# Patient Record
Sex: Male | Born: 1970 | Race: White | Hispanic: No | Smoking: Current every day smoker
Health system: Southern US, Community
[De-identification: ages and names within clinical notes are randomized; demographics above are authoritative.]

## PROBLEM LIST (undated history)

## (undated) DIAGNOSIS — I10 Essential (primary) hypertension: Secondary | ICD-10-CM

## (undated) DIAGNOSIS — Z789 Other specified health status: Secondary | ICD-10-CM

## (undated) HISTORY — PX: APPENDECTOMY: SHX54

## (undated) HISTORY — PX: HERNIA REPAIR: SHX51

---

## 1998-01-19 ENCOUNTER — Emergency Department (HOSPITAL_COMMUNITY): Admission: EM | Admit: 1998-01-19 | Discharge: 1998-01-19 | Payer: Self-pay | Admitting: Emergency Medicine

## 1998-11-12 ENCOUNTER — Emergency Department (HOSPITAL_COMMUNITY): Admission: EM | Admit: 1998-11-12 | Discharge: 1998-11-12 | Payer: Self-pay | Admitting: Emergency Medicine

## 1998-11-12 ENCOUNTER — Encounter: Payer: Self-pay | Admitting: Emergency Medicine

## 2012-01-29 ENCOUNTER — Encounter (HOSPITAL_COMMUNITY): Payer: Self-pay | Admitting: Emergency Medicine

## 2012-01-29 ENCOUNTER — Inpatient Hospital Stay (HOSPITAL_COMMUNITY)
Admission: EM | Admit: 2012-01-29 | Discharge: 2012-02-02 | DRG: 641 | Disposition: A | Discharge status: Home / Self Care | Payer: 59 | Attending: Internal Medicine | Admitting: Internal Medicine

## 2012-01-29 DIAGNOSIS — Z8249 Family history of ischemic heart disease and other diseases of the circulatory system: Secondary | ICD-10-CM

## 2012-01-29 DIAGNOSIS — G723 Periodic paralysis: Secondary | ICD-10-CM | POA: Diagnosis present

## 2012-01-29 DIAGNOSIS — E876 Hypokalemia: Principal | ICD-10-CM

## 2012-01-29 DIAGNOSIS — Z9089 Acquired absence of other organs: Secondary | ICD-10-CM

## 2012-01-29 DIAGNOSIS — Z87891 Personal history of nicotine dependence: Secondary | ICD-10-CM

## 2012-01-29 DIAGNOSIS — M6282 Rhabdomyolysis: Secondary | ICD-10-CM

## 2012-01-29 DIAGNOSIS — E860 Dehydration: Secondary | ICD-10-CM

## 2012-01-29 DIAGNOSIS — R030 Elevated blood-pressure reading, without diagnosis of hypertension: Secondary | ICD-10-CM | POA: Diagnosis present

## 2012-01-29 DIAGNOSIS — IMO0001 Reserved for inherently not codable concepts without codable children: Secondary | ICD-10-CM | POA: Diagnosis present

## 2012-01-29 HISTORY — DX: Other specified health status: Z78.9

## 2012-01-29 LAB — BASIC METABOLIC PANEL
BUN: 5 mg/dL — ABNORMAL LOW (ref 6–23)
CO2: 31 mEq/L (ref 19–32)
Calcium: 9.2 mg/dL (ref 8.4–10.5)
Chloride: 98 mEq/L (ref 96–112)
Creatinine, Ser: 0.73 mg/dL (ref 0.50–1.35)
GFR calc Af Amer: >90 mL/min (ref >90)
GFR calc non Af Amer: >90 mL/min (ref >90)
Glucose, Bld: 104 mg/dL — ABNORMAL HIGH (ref 70–99)
Potassium: <2 mEq/L — CL (ref 3.5–5.1)
Sodium: 139 mEq/L (ref 135–145)

## 2012-01-29 LAB — CK TOTAL AND CKMB (NOT AT ARMC)
CK, MB: 3.4 ng/mL (ref 0.3–4.0)
Relative Index: 0.6 (ref 0.0–2.5)
Total CK: 574 U/L — ABNORMAL HIGH (ref 7–232)

## 2012-01-29 MED ORDER — POTASSIUM CHLORIDE CRYS ER 20 MEQ PO TBCR
40.0000 meq | EXTENDED_RELEASE_TABLET | Freq: Once | ORAL | Status: AC
  Administered 2012-01-29: 40 meq via ORAL
  Filled 2012-01-29: qty 2

## 2012-01-29 MED ORDER — SODIUM CHLORIDE 0.9 % IV BOLUS (SEPSIS)
1000.0000 mL | Freq: Once | INTRAVENOUS | Status: AC
  Administered 2012-01-29: 1000 mL via INTRAVENOUS

## 2012-01-29 MED ORDER — POTASSIUM CHLORIDE 10 MEQ/100ML IV SOLN
10.0000 meq | Freq: Once | INTRAVENOUS | Status: AC
  Administered 2012-01-29: 10 meq via INTRAVENOUS
  Filled 2012-01-29: qty 100

## 2012-01-29 MED ORDER — KETOROLAC TROMETHAMINE 30 MG/ML IJ SOLN
30.0000 mg | Freq: Once | INTRAMUSCULAR | Status: AC
  Administered 2012-01-29: 30 mg via INTRAVENOUS
  Filled 2012-01-29: qty 1

## 2012-01-29 MED ORDER — POTASSIUM CHLORIDE CRYS ER 20 MEQ PO TBCR: 40.0000 meq | EXTENDED_RELEASE_TABLET | Freq: Once | ORAL | Status: DC

## 2012-01-29 MED ORDER — POTASSIUM CHLORIDE CRYS ER 20 MEQ PO TBCR
60.0000 meq | EXTENDED_RELEASE_TABLET | Freq: Two times a day (BID) | ORAL | Status: DC
  Administered 2012-01-30 – 2012-01-31 (×4): 60 meq via ORAL
  Filled 2012-01-29 (×5): qty 3

## 2012-01-29 MED ORDER — ONDANSETRON HCL 4 MG PO TABS: 4.0000 mg | ORAL_TABLET | Freq: Four times a day (QID) | ORAL | Status: DC | PRN

## 2012-01-29 MED ORDER — KETOROLAC TROMETHAMINE 30 MG/ML IJ SOLN
30.0000 mg | Freq: Once | INTRAMUSCULAR | Status: AC
  Administered 2012-01-29: 30 mg via INTRAVENOUS
  Filled 2012-01-29 (×2): qty 1

## 2012-01-29 MED ORDER — ONDANSETRON HCL 4 MG/2ML IJ SOLN
4.0000 mg | Freq: Four times a day (QID) | INTRAMUSCULAR | Status: DC | PRN
  Administered 2012-01-29: 4 mg via INTRAVENOUS
  Filled 2012-01-29: qty 2

## 2012-01-29 MED ORDER — SODIUM CHLORIDE 0.9 % IV SOLN
INTRAVENOUS | Status: DC
  Administered 2012-01-29: 1000 mL via INTRAVENOUS

## 2012-01-29 NOTE — ED Notes (Signed)
Reports all muscles are hurting--started yesterday-- went to park on Saturday and rock climbed; no fevers/chills; has not noticed any ticks or bug bites

## 2012-01-29 NOTE — ED Provider Notes (Addendum)
History     CSN: [id number]  Arrival date & time 01/29/12  1914   First MD Initiated Contact with Patient 01/29/12 2032      Chief Complaint  Patient presents with  . Muscle Pain    (Consider location/radiation/quality/duration/timing/severity/associated sxs/prior treatment) Patient is a 41 y.o. male presenting with musculoskeletal pain. The history is provided by the patient.  Muscle Pain Pertinent negatives include no chest pain, no abdominal pain, no headaches and no shortness of breath.   a 41 year old, male, with no significant past medical history presents emergency department complaining of eye oranges, in his arms and legs.  He states that he was in a mountain climbing rocks with his children and he was lifting them up and down over, and over for a few hours.  He denies nausea, vomiting, fevers, chills, or rash.  History reviewed. No pertinent past medical history.  Past Surgical History  Procedure Date  . Appendectomy   . Hernia repair     No family history on file.  History  Substance Use Topics  . Smoking status: Former Smoker    Types: Cigarettes    Quit date: 09/25/2011  . Smokeless tobacco: Not on file   Comment: using electronical cigs now  . Alcohol Use: No      Review of Systems  Constitutional: Negative for fever and chills.  HENT: Negative for neck pain and neck stiffness.   Eyes: Negative for redness.  Respiratory: Negative for shortness of breath.   Cardiovascular: Negative for chest pain.  Gastrointestinal: Negative for nausea, vomiting and abdominal pain.  Genitourinary:       No change in urine color  Musculoskeletal: Positive for myalgias.  Skin: Negative for rash.  Neurological: Negative for weakness and headaches.  Hematological: Does not bruise/bleed easily.  Psychiatric/Behavioral: Negative for confusion.  All other systems reviewed and are negative.    Allergies  Review of patient's allergies indicates no known  allergies.  Home Medications   Current Outpatient Rx  Name Route Sig Dispense Refill  . CETIRIZINE-PSEUDOEPHEDRINE ER 5-120 MG PO TB12 Oral Take 1 tablet by mouth daily.      BP 140/101  Pulse 92  Temp 98.6 F (37 C) (Oral)  Resp 18  Ht 5\' 7"  (1.702 m)  Wt 170 lb (77.111 kg)  BMI 26.63 kg/m2  SpO2 98%  Physical Exam  Nursing note and vitals reviewed. Constitutional: He is oriented to person, place, and time. He appears well-developed and well-nourished.  HENT:  Head: Normocephalic and atraumatic.  Eyes: Conjunctivae are normal.  Neck: Normal range of motion. Neck supple.  Cardiovascular: Normal rate.   No murmur heard. Pulmonary/Chest: Effort normal and breath sounds normal. No respiratory distress.  Abdominal: Soft. He exhibits no distension. There is no tenderness.  Musculoskeletal: Normal range of motion. He exhibits no edema.       Thigh and forearm tenderness, with no swelling  Lymphadenopathy:    He has no cervical adenopathy.  Neurological: He is alert and oriented to person, place, and time.  Skin: Skin is warm and dry.  Psychiatric: He has a normal mood and affect. Thought content normal.    ED Course  Procedures (including critical care time) 41 year old, male, with total body myalgias, after strenuous exercise, lifting.  His children for several hours.  We will establish an IV and give analgesics.  I will check him for rhabdomyolysis   Labs Reviewed  BASIC METABOLIC PANEL  CK TOTAL AND CKMB   No results found.  No diagnosis found.  ECG Sinus rhythm at 76 beats per minute. Right axis deviation. Normal intervals. Nonspecific T-wave changes  Spoke with the [facility] hospitalist.  He will admit to the hospital for potassium replacement, and treatment of rhabdomyolysis.  CRITICAL CARE Performed by: Nicholes Stairs   Total critical care time: 30 min  Critical care time was exclusive of separately billable procedures and treating other  patients.  Critical care was necessary to treat or prevent imminent or life-threatening deterioration.  Critical care was time spent personally by me on the following activities: development of treatment plan with patient and/or surrogate as well as nursing, discussions with consultants, evaluation of patient's response to treatment, examination of patient, obtaining history from patient or surrogate, ordering and performing treatments and interventions, ordering and review of laboratory studies, ordering and review of radiographic studies, pulse oximetry and re-evaluation of patient's condition.    Hypokalemia  With nonspecific tw changes on ecg Rhabdomyolysis         Cheri Guppy, MD 01/29/12 5621  Cheri Guppy, MD 01/29/12 2304

## 2012-01-29 NOTE — H&P (Addendum)
Dustin Kirby is an 41 y.o. male.   Patient was seen and examined on January 29, 2012 at 11:50 PM. PCP - none. Chief Complaint: Muscle cramps. HPI: 41 year-old male with no significant past history who has recently quit smoking 4 months ago had gone for hiking with his family last Saturday 3 days ago. Since then he has developed slowly generalized body aches and cramps which has been worsening to the point that he was not able to walk today and was brought to the ER. In the ER he is found to have severe hypokalemia with mildly elevated CK levels. Patient has been admitted for further management. His EKG shows U waves. Patient has difficulty moving both lower extremities. This happened since today morning. Denies any incontinence of urine or bowel. Denies chest pain shortness of breath nausea vomiting or diarrhea or abdominal pain. Patient states about 15 years ago he had an episode of shortness of breath and at that time he had muscle cramps and was told he had hypokalemia.  Past Medical History  Diagnosis Date  . No pertinent past medical history     Past Surgical History  Procedure Date  . Appendectomy   . Hernia repair     Family History  Problem Relation Age of Onset  . Hypertension Mother    Social History:  reports that he quit smoking about 4 months ago. His smoking use included Cigarettes. He does not have any smokeless tobacco history on file. He reports that he does not drink alcohol or use illicit drugs.  Allergies: No Known Allergies   (Not in a hospital admission)  Results for orders placed during the hospital encounter of 01/29/12 (from the past 48 hour(s))  BASIC METABOLIC PANEL     Status: Abnormal   Collection Time   01/29/12  9:28 PM      Component Value Range Comment   Sodium 139  135 - 145 mEq/L    Potassium <2.0 (*) 3.5 - 5.1 mEq/L    Chloride 98  96 - 112 mEq/L    CO2 31  19 - 32 mEq/L    Glucose, Bld 104 (*) 70 - 99 mg/dL    BUN 5 (*) 6 - 23 mg/dL    Creatinine,  Ser 0.98  0.50 - 1.35 mg/dL    Calcium 9.2  8.4 - 11.9 mg/dL    GFR calc non Af Amer >90  >90 mL/min    GFR calc Af Amer >90  >90 mL/min   CK TOTAL AND CKMB     Status: Abnormal   Collection Time   01/29/12  9:28 PM      Component Value Range Comment   Total CK 574 (*) 7 - 232 U/L    CK, MB 3.4  0.3 - 4.0 ng/mL    Relative Index 0.6  0.0 - 2.5   MAGNESIUM     Status: Normal   Collection Time   01/29/12  9:28 PM      Component Value Range Comment   Magnesium 2.0  1.5 - 2.5 mg/dL    No results found.  Review of Systems  HENT: Negative.   Eyes: Negative.   Respiratory: Negative.   Cardiovascular: Negative.   Gastrointestinal: Negative.   Genitourinary: Negative.   Musculoskeletal:       Muscle cramps.  Skin: Negative.   Neurological: Positive for weakness.       Lower extremity weakness.  Psychiatric/Behavioral: Negative.     Blood pressure 140/101,  pulse 92, temperature 98.6 F (37 C), temperature source Oral, resp. rate 18, height 5\' 7"  (1.702 m), weight 77.111 kg (170 lb), SpO2 98.00%. Physical Exam  Constitutional: He is oriented to person, place, and time. He appears well-developed and well-nourished. No distress.  HENT:  Head: Normocephalic and atraumatic.  Right Ear: External ear normal.  Left Ear: External ear normal.  Nose: Nose normal.  Mouth/Throat: Oropharynx is clear and moist. No oropharyngeal exudate.  Eyes: Conjunctivae are normal. Pupils are equal, round, and reactive to light. Right eye exhibits no discharge. Left eye exhibits no discharge. No scleral icterus.  Neck: Normal range of motion. Neck supple.  Cardiovascular: Normal rate and regular rhythm.   Respiratory: Effort normal and breath sounds normal. No respiratory distress. He has no wheezes. He has no rales.  GI: Soft. Bowel sounds are normal.  Musculoskeletal: He exhibits no edema and no tenderness.  Neurological: He is alert and oriented to person, place, and time.       Has poor strengths in  both lower extremities with intact deep tendon reflexes.  Skin: Skin is warm and dry. He is not diaphoretic.  Psychiatric: His behavior is normal.     Assessment/Plan #1. Severe hypokalemia - patient's potassium is less than 2 with EKG changes. At this time patient is getting potassium replacement through both IV and by mouth. Given his severe hypokalemia with metabolic alkalosis we should be concerned about other causes including Barter syndrome and Gitelman syndrome. 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 But also could be from dehydration. Check urine sodium and potassium and creatinine. I have also ordered urine chloride levels and urinary calcium levels specifically to look for Barter and Gitelman syndrome. Check renin aldosterone ratio.Check magnesium level. May consult nephrology in a.m.  #2. Mild rhabdomyolysis - aggressively hydrate with fluids. Follow CK levels and metabolic panel magnesium levels and phosphate levels. #3. Weakness of both lower extremity with muscle cramps - is most likely is from severe hypokalemia which I think will get better with replacement of his potassium and fluids. If it does not get better with replacement of potassium then may consult neurology.  A CBC and chest x-ray has been ordered and has to be followed.  CODE STATUS - full code.  Laasia Arcos N. 01/29/2012, 11:50 PM

## 2012-01-29 NOTE — ED Notes (Signed)
Patient feeling nauseated at this time.  Patient to be medicated per admission orders.

## 2012-01-30 ENCOUNTER — Encounter (HOSPITAL_COMMUNITY): Payer: Self-pay | Admitting: *Deleted

## 2012-01-30 ENCOUNTER — Inpatient Hospital Stay (HOSPITAL_COMMUNITY): Payer: 59

## 2012-01-30 LAB — COMPREHENSIVE METABOLIC PANEL
ALT: 17 U/L (ref 0–53)
Alkaline Phosphatase: 67 U/L (ref 39–117)
BUN: 5 mg/dL — ABNORMAL LOW (ref 6–23)
CO2: 31 mEq/L (ref 19–32)
Chloride: 105 mEq/L (ref 96–112)
GFR calc Af Amer: >90 mL/min (ref >90)
GFR calc non Af Amer: >90 mL/min (ref >90)
Glucose, Bld: 118 mg/dL — ABNORMAL HIGH (ref 70–99)
Potassium: <2 mEq/L — CL (ref 3.5–5.1)
Sodium: 143 mEq/L (ref 135–145)
Total Bilirubin: 0.5 mg/dL (ref 0.3–1.2)
Total Protein: 6.3 g/dL (ref 6.0–8.3)

## 2012-01-30 LAB — BASIC METABOLIC PANEL
Chloride: 106 mEq/L (ref 96–112)
GFR calc non Af Amer: >90 mL/min (ref >90)
Glucose, Bld: 112 mg/dL — ABNORMAL HIGH (ref 70–99)
Potassium: 3.5 mEq/L (ref 3.5–5.1)
Sodium: 143 mEq/L (ref 135–145)

## 2012-01-30 LAB — URINALYSIS, ROUTINE W REFLEX MICROSCOPIC
Bilirubin Urine: NEGATIVE
Glucose, UA: NEGATIVE mg/dL
Hgb urine dipstick: NEGATIVE
Nitrite: NEGATIVE
Specific Gravity, Urine: 1.006 (ref 1.005–1.030)
pH: 7 (ref 5.0–8.0)

## 2012-01-30 LAB — CHLORIDE, URINE, RANDOM: Chloride Urine: 84 mEq/L

## 2012-01-30 LAB — NA AND K (SODIUM & POTASSIUM), RAND UR: Potassium Urine: 6 mEq/L

## 2012-01-30 LAB — MAGNESIUM: Magnesium: 1.8 mg/dL (ref 1.5–2.5)

## 2012-01-30 MED ORDER — POTASSIUM CHLORIDE CRYS ER 20 MEQ PO TBCR
60.0000 meq | EXTENDED_RELEASE_TABLET | ORAL | Status: AC
  Administered 2012-01-30: 60 meq via ORAL
  Filled 2012-01-30: qty 3

## 2012-01-30 MED ORDER — POTASSIUM CHLORIDE 10 MEQ/100ML IV SOLN
10.0000 meq | INTRAVENOUS | Status: AC
  Administered 2012-01-30 (×6): 10 meq via INTRAVENOUS
  Filled 2012-01-30 (×6): qty 100

## 2012-01-30 MED ORDER — POTASSIUM CHLORIDE 10 MEQ/100ML IV SOLN
10.0000 meq | INTRAVENOUS | Status: AC
  Administered 2012-01-30 (×6): 10 meq via INTRAVENOUS
  Filled 2012-01-30: qty 100
  Filled 2012-01-30: qty 300
  Filled 2012-01-30: qty 200
  Filled 2012-01-30: qty 100

## 2012-01-30 MED ORDER — ACETAMINOPHEN 650 MG RE SUPP: 650.0000 mg | Freq: Four times a day (QID) | RECTAL | Status: DC | PRN

## 2012-01-30 MED ORDER — SODIUM CHLORIDE 0.9 % IJ SOLN
3.0000 mL | Freq: Two times a day (BID) | INTRAMUSCULAR | Status: DC
  Administered 2012-01-31: 3 mL via INTRAVENOUS

## 2012-01-30 MED ORDER — ACETAMINOPHEN 325 MG PO TABS
650.0000 mg | ORAL_TABLET | Freq: Four times a day (QID) | ORAL | Status: DC | PRN
  Administered 2012-02-01: 650 mg via ORAL
  Filled 2012-01-30: qty 2

## 2012-01-30 MED ORDER — POTASSIUM CHLORIDE IN NACL 40-0.9 MEQ/L-% IV SOLN
INTRAVENOUS | Status: DC
  Administered 2012-01-30: 125 mL/h via INTRAVENOUS
  Administered 2012-01-30: 01:00:00 via INTRAVENOUS
  Administered 2012-01-31: 75 mL/h via INTRAVENOUS
  Filled 2012-01-30 (×8): qty 1000

## 2012-01-30 NOTE — Progress Notes (Signed)
CRITICAL VALUE ALERT  Critical value received: potassium level <2  Date of notification: 01/30/2012  Time of notification: 0530  Critical value read back:yes  Nurse who received alert: Antanasia Kaczynski rn  MD notified (1st page):harduck NP  Time of first page: 0535  MD notified (2nd page):  Time of second page:  Responding MD:L.Harduck NP    Time MD responded:  [phone]

## 2012-01-30 NOTE — Progress Notes (Signed)
[facility] PROGRESS NOTE  Dustin Kirby:[id number] DOB: 1971-06-11 DOA: 01/29/2012 PCP: [name], MD  Assessment/Plan: Principal Problem:  *Hypokalemia Evaluating for Bartters and Gittlemans syndrome. 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 Episode occurred after exertion this time and possibly also 15  Yrs ago as well. Will repeat K+ level in AM. F/u on 24 hr urinary calcium levels.   Active Problems:  Rhabdomyolysis Mild - levels slightly increased- likely related to hypokalemia and cramping   Dehydration Improved. Can decrease IVF.    Code Status: full  Disposition Plan: follow in SDU due to unpredictable K+ levels.    Brief narrative: 41 year-old male with no significant past history who has recently quit smoking 4 months ago had gone for hiking with his family last Saturday 3 days ago. Since then he has developed slowly generalized body aches and cramps which has been worsening to the point that he was not able to walk today and was brought to the ER. In the ER he is found to have severe hypokalemia with mildly elevated CK levels. Patient has been admitted for further management. His EKG shows U waves. Patient has difficulty moving both lower extremities. This happened since today morning. Denies any incontinence of urine or bowel. Denies chest pain shortness of breath nausea vomiting or diarrhea or abdominal pain. Patient states about 15 years ago he had an episode of shortness of breath and at that time he had muscle cramps and was told he had hypokalemia.   Consultants:  Will speak with nephrology to assist in f/u of lab work as patient does not have PCP.   HPI/Subjective: He is able to move his legs today and has ambulated to the bathroom. He is having some soreness in left groin which was present prior to hiking as well.   Objective: Filed Vitals:   01/30/12 0600 01/30/12 0755 01/30/12 1253 01/30/12 1555  BP: 112/68 118/65 145/72 141/77  Pulse: 61 88  82 94  Temp:  97.9 F (36.6 C) 98.3 F (36.8 C) 98.5 F (36.9 C)  TempSrc:  Oral Oral Oral  Resp: 24 21 29 18   Height:      Weight:      SpO2: 98% 100% 100% 99%    Intake/Output Summary (Last 24 hours) at 01/30/12 1710 Last data filed at 01/30/12 1254  Gross per 24 hour  Intake   3585 ml  Output   2150 ml  Net   1435 ml    Exam:   General:  No acute distress  Cardiovascular: RRR, No murmurs  Respiratory: CTA b/l  Abdomen: soft, NT, ND, BS+  Ext: no c/c/e  Neuro: 5/5 strength in all 4 extremities, Pt has pain in b/l hips when asked to keep legs elevated against pressure.   Data Reviewed: Basic Metabolic Panel:  Lab 01/30/12 9562 01/30/12 0415 01/29/12 2128  NA 143 143 139  K 3.5 <2.0* <2.0*  CL 106 105 98  CO2 29 31 31   GLUCOSE 112* 118* 104*  BUN 5* 5* 5*  CREATININE 0.68 0.79 0.73  CALCIUM 8.3* 8.0* 9.2  MG -- 1.8 2.0  PHOS -- 1.1* --   Liver Function Tests:  Lab 01/30/12 0415  AST 28  ALT 17  ALKPHOS 67  BILITOT 0.5  PROT 6.3  ALBUMIN 3.4*   No results found for this basename: LIPASE:5,AMYLASE:5 in the last 168 hours No results found for this basename: AMMONIA:5 in the last 168 hours CBC: No results found for this basename: WBC:5,NEUTROABS:5,HGB:5,HCT:5,MCV:5,PLT:5  in the last 168 hours Cardiac Enzymes:  Lab 01/30/12 0415 01/29/12 2128  CKTOTAL 614* 574*  CKMB 3.9 3.4  CKMBINDEX -- --  TROPONINI <0.30 --   BNP (last 3 results) No results found for this basename: PROBNP:3 in the last 8760 hours CBG: No results found for this basename: GLUCAP:5 in the last 168 hours  Recent Results (from the past 240 hour(s))  MRSA PCR SCREENING     Status: Normal   Collection Time   01/30/12 12:31 AM      Component Value Range Status Comment   MRSA by PCR NEGATIVE  NEGATIVE Final      Studies: Dg Chest Port 1 View  01/30/2012  *RADIOLOGY REPORT*  Clinical Data: Shortness of breath.  PORTABLE CHEST - 1 VIEW  Comparison: None.  Findings: Heart and  mediastinal contours are within normal limits. No focal opacities or effusions.  No acute bony abnormality.  IMPRESSION: No active cardiopulmonary disease.  Original Report Authenticated By: Cyndie Chime, M.D.    Scheduled Meds:   . ketorolac  30 mg Intravenous Once  . ketorolac  30 mg Intravenous Once  . potassium chloride  10 mEq Intravenous Once  . potassium chloride  10 mEq Intravenous Q1 Hr x 6  . potassium chloride  10 mEq Intravenous Q1 Hr x 6  . potassium chloride  40 mEq Oral Once  . potassium chloride  40 mEq Oral Once  . potassium chloride  60 mEq Oral BID  . potassium chloride  60 mEq Oral NOW  . sodium chloride  1,000 mL Intravenous Once  . sodium chloride  3 mL Intravenous Q12H  . DISCONTD: sodium chloride   Intravenous STAT   Continuous Infusions:   . 0.9 % NaCl with KCl 40 mEq / L 125 mL/hr (01/30/12 1648)    ________________________________________________________________________  Time spent: 25 min    [facility]  [facility] Pager [phone] If 8PM-8AM, please contact night-coverage at [email or web address], password [facility] 01/30/2012, 5:10 PM  LOS: 1 day

## 2012-01-31 DIAGNOSIS — G723 Periodic paralysis: Secondary | ICD-10-CM | POA: Diagnosis present

## 2012-01-31 LAB — BASIC METABOLIC PANEL
BUN: 3 mg/dL — ABNORMAL LOW (ref 6–23)
Chloride: 101 mEq/L (ref 96–112)
GFR calc non Af Amer: >90 mL/min (ref >90)
Glucose, Bld: 83 mg/dL (ref 70–99)
Potassium: 4 mEq/L (ref 3.5–5.1)

## 2012-01-31 MED ORDER — POTASSIUM CHLORIDE CRYS ER 20 MEQ PO TBCR
40.0000 meq | EXTENDED_RELEASE_TABLET | Freq: Two times a day (BID) | ORAL | Status: DC
  Administered 2012-01-31 – 2012-02-02 (×4): 40 meq via ORAL
  Filled 2012-01-31 (×5): qty 2

## 2012-01-31 NOTE — Progress Notes (Signed)
[facility] Progress Note [place] TEAM 1 - Stepdown/ICU TEAM   NAOD SWEETLAND AVW:[id number] DOB: 1971-03-08 DOA: 01/29/2012 PCP: [name], MD  Brief narrative: 41 year-old male with no significant past history who quit smoking 4 months ago had gone for a hike. Since then he has developed slowly generalized body aches and cramps which have been worsening to the point that he was not able to walk today and was brought to the ER. In the ER he was found to have severe hypokalemia with mildly elevated CK levels. Patient has been admitted for further management. His EKG shows U waves. Patient has difficulty moving both lower extremities. Denies any incontinence of urine or bowel. Denies chest pain shortness of breath nausea vomiting or diarrhea or abdominal pain. Patient states about 15 years ago he had an episode of shortness of breath and at that time he had muscle cramps and was told he had hypokalemia.  Assessment/Plan:  Hypokalemia Evaluating for Bartters and Gittlemans syndromes vs/ hypokalemia periodic paralysis - Gittlemans syndrome is far more common however magnesium has proven to be normal - there is no evidence of metabolic alkalosis - potassium appears to be stabilizing but we will need to continue to follow it to assure that it does not rapidly dropping - patient is now freely ambulating  Myositis / very mild Rhabdomyolysis CK elevation is actually quite mild but is indeed climbing - we will need to follow it to assure that it normalizes   Severe dehydration This has been corrected with volume resuscitation  Code Status: Full Disposition Plan: Transfer to telemetry bed  Consultants: None  Procedures: None  Antibiotics: None  DVT prophylaxis: SCDs  HPI/Subjective: The patient is doing quite well today.  He reports no further cramps or muscle weakness.  He is able to an event about the room without difficulty.  He is tolerating a regular  diet.   Objective: Blood pressure 135/74, pulse 77, temperature 98.2 F (36.8 C), temperature source Oral, resp. rate 28, height 5\' 7"  (1.702 m), weight 77 kg (169 lb 12.1 oz), SpO2 99.00%.  Intake/Output Summary (Last 24 hours) at 01/31/12 1254 Last data filed at 01/31/12 1100  Gross per 24 hour  Intake   4540 ml  Output   5753 ml  Net  -1213 ml     Exam: General: No acute respiratory distress Lungs: Clear to auscultation bilaterally without wheezes or crackles Cardiovascular: Regular rate and rhythm without murmur gallop or rub  Abdomen: Nontender, nondistended, soft, bowel sounds positive, no rebound, no ascites, no appreciable mass Extremities: No significant cyanosis, clubbing, or edema bilateral lower extremities  Data Reviewed: Basic Metabolic Panel:  Lab 01/31/12 8295 01/30/12 2133 01/30/12 1422 01/30/12 0415 01/29/12 2128  NA -- -- 143 143 139  K 3.9 3.4* 3.5 <2.0* <2.0*  CL -- -- 106 105 98  CO2 -- -- 29 31 31   GLUCOSE -- -- 112* 118* 104*  BUN -- -- 5* 5* 5*  CREATININE -- -- 0.68 0.79 0.73  CALCIUM -- -- 8.3* 8.0* 9.2  MG -- -- -- 1.8 2.0  PHOS -- -- -- 1.1* --   Liver Function Tests:  Lab 01/30/12 0415  AST 28  ALT 17  ALKPHOS 67  BILITOT 0.5  PROT 6.3  ALBUMIN 3.4*   Cardiac Enzymes:  Lab 01/30/12 0415 01/29/12 2128  CKTOTAL 614* 574*  CKMB 3.9 3.4  CKMBINDEX -- --  TROPONINI <0.30 --   Recent Results (from the past 240 hour(s))  MRSA PCR SCREENING     Status: Normal   Collection Time   01/30/12 12:31 AM      Component Value Range Status Comment   MRSA by PCR NEGATIVE  NEGATIVE Final     Studies:  Recent x-ray studies have been reviewed in detail by the Attending Physician  Scheduled Meds:  Reviewed in detail by the Attending Physician   Lonia Blood, MD [facility] Office  [phone] Pager [phone]  On-Call/Text Page:      [email or web address]      password [email or web address]  If 7PM-7AM, please contact  night-coverage [email or web address] Password [email or web address] 01/31/2012, 12:54 PM   LOS: 2 days

## 2012-02-01 DIAGNOSIS — G723 Periodic paralysis: Secondary | ICD-10-CM

## 2012-02-01 LAB — BASIC METABOLIC PANEL
BUN: 5 mg/dL — ABNORMAL LOW (ref 6–23)
BUN: 6 mg/dL (ref 6–23)
Calcium: 9.4 mg/dL (ref 8.4–10.5)
Calcium: 9.8 mg/dL (ref 8.4–10.5)
Creatinine, Ser: 0.68 mg/dL (ref 0.50–1.35)
Creatinine, Ser: 0.74 mg/dL (ref 0.50–1.35)
GFR calc Af Amer: >90 mL/min (ref >90)
GFR calc Af Amer: >90 mL/min (ref >90)
GFR calc non Af Amer: >90 mL/min (ref >90)
GFR calc non Af Amer: >90 mL/min (ref >90)
Glucose, Bld: 99 mg/dL (ref 70–99)
Potassium: 4.5 mEq/L (ref 3.5–5.1)

## 2012-02-01 LAB — CALCIUM, URINE, 24 HOUR
Calcium, 24 hour urine: 80 mg/d — ABNORMAL LOW (ref 100–250)
Urine Total Volume-UCA24: 8000 mL

## 2012-02-01 LAB — TSH: TSH: 4.259 u[IU]/mL (ref 0.350–4.500)

## 2012-02-01 MED ORDER — MAGNESIUM SULFATE 50 % IJ SOLN
8.0000 g | Freq: Once | INTRAVENOUS | Status: AC
  Administered 2012-02-01: 8 g via INTRAVENOUS
  Filled 2012-02-01: qty 16

## 2012-02-01 MED ORDER — SODIUM CHLORIDE 0.9 % IV SOLN
INTRAVENOUS | Status: DC
  Administered 2012-02-01: 10:00:00 via INTRAVENOUS

## 2012-02-01 NOTE — Progress Notes (Signed)
[facility] Progress Note [place] TEAM 1 - Stepdown/ICU TEAM   QUAVION BOULE RUE:[id number] DOB: July 28, 1970 DOA: 01/29/2012 PCP: [name], MD  Brief narrative: 41 year-old male with no significant past history who quit smoking 4 months ago had gone for a hike. Since then he has developed slowly generalized body aches and cramps which have been worsening to the point that he was not able to walk today and was brought to the ER. In the ER he was found to have severe hypokalemia with mildly elevated CK levels. Patient has been admitted for further management. His EKG shows U waves. Patient has difficulty moving both lower extremities. Denies any incontinence of urine or bowel. Denies chest pain shortness of breath nausea vomiting or diarrhea or abdominal pain. Patient states about 15 years ago he had an episode of shortness of breath and at that time he had muscle cramps and was told he had hypokalemia.  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 Lower than average height. 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   Myositis / very mild Rhabdomyolysis -CK elevation is actually quite mild but is indeed climbing - we will need to follow it to assure that it normalizes. -check a TSH which can cause elevation in CK.  Severe dehydration This has been corrected with volume resuscitation  Code Status: Full Disposition Plan: Transfer to telemetry bed  Consultants: Renal  Procedures: None  Antibiotics: None  DVT prophylaxis: SCDs  HPI/Subjective: The patient is doing quite well today.  He reports no further cramps or muscle weakness.   Able to ambulate with out any difficulties.  Objective: Blood pressure 113/73,  pulse 73, temperature 98.2 F (36.8 C), temperature source Oral, resp. rate 20, height 5\' 7"  (1.702 m), weight 76.1 kg (167 lb 12.3 oz), SpO2 99.00%.  Intake/Output Summary (Last 24 hours) at 02/01/12 1331 Last data filed at 02/01/12 0900  Gross per 24 hour  Intake 1548.33 ml  Output   1375 ml  Net 173.33 ml     Exam: General: No acute respiratory distress Lungs: Clear to auscultation bilaterally without wheezes or crackles Cardiovascular: Regular rate and rhythm without murmur gallop or rub  Abdomen: Nontender, nondistended, soft, bowel sounds positive, no rebound, no ascites, no appreciable mass Extremities: No significant cyanosis, clubbing, or edema bilateral lower extremities  Data Reviewed: Basic Metabolic Panel:  Lab 02/01/12 1478 01/31/12 1735 01/31/12 0525 01/30/12 2133 01/30/12 1422 01/30/12 0415 01/29/12 2128  NA 137 137 -- -- 143 143 139  K 4.5 4.0 3.9 3.4* 3.5 -- --  CL 101 101 -- -- 106 105 98  CO2 28 28 -- -- 29 31 31   GLUCOSE 82 83 -- -- 112* 118* 104*  BUN 5* 3* -- -- 5* 5* 5*  CREATININE 0.68 0.65 -- -- 0.68 0.79 0.73  CALCIUM 9.4 9.4 -- -- 8.3* 8.0* 9.2  MG -- -- -- -- -- 1.8 2.0  PHOS 2.8 2.3 -- -- -- 1.1* --   Liver Function Tests:  Lab 01/30/12 0415  AST 28  ALT 17  ALKPHOS 67  BILITOT 0.5  PROT 6.3  ALBUMIN 3.4*   Cardiac Enzymes:  Lab 02/01/12 0520 01/30/12 0415 01/29/12 2128  CKTOTAL 1069* 614* 574*  CKMB -- 3.9 3.4  CKMBINDEX -- -- --  TROPONINI -- <0.30 --   Recent Results (from the past 240 hour(s))  MRSA PCR SCREENING     Status: Normal   Collection Time   01/30/12 12:31 AM      Component Value Range Status Comment   MRSA by PCR NEGATIVE  NEGATIVE Final     Studies:  Recent x-ray studies have been reviewed in detail by the Attending Physician  Scheduled Meds:  Reviewed in detail by the Attending Physician   Lonia Blood, MD [facility] Office  [phone] Pager [phone]  On-Call/Text Page:       [email or web address]      password [email or web address]  If 7PM-7AM, please contact night-coverage [email or web address] Password [email or web address] 02/01/2012, 1:31 PM   LOS: 3 days

## 2012-02-01 NOTE — Consult Note (Addendum)
Nephrology Service Initial Consult Note  Reason for Consult: Hypokalemia Referring Physician: Dr. David Kirby  HPI: Dustin Kirby is an 41 y.o. male, with no significant medical history, presenting with generalized "body aches" and hypokalemia.  The patient notes that on 8/3, he spent a lot of time outside in the woods with his daughters, and notes sweating a lot that day but not drinking many fluids.  That evening, and particularly the next day, he noted progressive body aches, most noticeable in his legs, but also in his arms and torso, making it difficult for him to stand or move due to pain.  He presented to [facility] on 8/5, and was noted to have significant hypokalemia with K <2.0 and U waves on EKG.  He was repleted with 550 meQ of potassium over the last few days.  Further questioning reveals that patient notes 1 day of diarrhea about 1 week prior to admission, a mild back injury 2 weeks prior to admission, and intermittent dysuria which has been present "all my life".  He notes no fevers, chills, cp, SOB, nausea/vomiting, abd pain.  The patient notes 1 prior episode of hypokalemia about 15 yrs ago, in which he notes a few days of "not eating well", and experiencing symptoms of hyperventilation, and similar difficulty moving due to muscle aches.  He was admitted to [facility], diagnosed with hypokalemia, repleted, but believes he did not go home on potassium supplementation.  He has only seen occasional Urgent Care physicians since that time.   PMH:   Past Medical History  Diagnosis Date  . No pertinent past medical history   See above--1 prior episode of low K and weakness 15 yr ago No past hx hypertension  PSH:   Past Surgical History  Procedure Date  . Appendectomy   . Hernia repair     Allergies: No Known Allergies  Medications:   Medications Prior to Admission  Medication Sig Dispense Refill  . cetirizine-pseudoephedrine (ZYRTEC-D) 5-120 MG per tablet Take 1 tablet by mouth daily.      No  diuretics, no laxatives, no NSAIDs Scheduled Meds:   . potassium chloride  40 mEq Oral BID   Continuous Infusions:   . sodium chloride 100 mL/hr at 02/01/12 1000  . DISCONTD: 0.9 % NaCl with KCl 40 mEq / L 50 mL/hr at 02/01/12 0616   PRN Meds:.acetaminophen, acetaminophen, ondansetron (ZOFRAN) IV, ondansetron  Discontinued Meds:   Medications Discontinued During This Encounter  Medication Reason  . 0.9 %  sodium chloride infusion   . sodium chloride 0.9 % injection 3 mL   . potassium chloride SA (K-DUR,KLOR-CON) CR tablet 60 mEq   . potassium chloride SA (K-DUR,KLOR-CON) CR tablet 40 mEq   . 0.9 % NaCl with KCl 40 mEq / L  infusion     Family History:   Family History  Problem Relation Age of Onset  . 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  ROS: General: no fevers, chills, changes in weight, changes in appetite Skin: no rash HEENT: no blurry vision, hearing changes, sore throat Pulm: no  dyspnea, coughing, wheezing CV: no chest pain, palpitations, shortness of breath Abd: no abdominal pain, nausea/vomiting, diarrhea/constipation GU: no hematuria, polyuria Ext: see HPI Neuro: see HPI  Blood pressure 134/84, pulse 85, temperature 98.7 F (37.1 C), temperature source Oral, resp. rate 20, height 5\' 7"  (1.702 m), weight 167 lb 12.3 oz (76.1 kg), SpO2 98.00%.  PEX General: alert, cooperative, and in no apparent distress HEENT: pupils equal round and reactive to light, vision grossly intact, oropharynx clear and non-erythematous, upper  dentures, poor oral hygiene lower teeth  Neck: supple, no lymphadenopathy, no JVD Lungs: clear to ascultation bilaterally, normal work of respiration, no wheezes, rales, ronchi Heart: regular rate and rhythm, no murmurs, gallops, or rubs Abdomen: soft, non-tender, non-distended, normal bowel sounds Msk: no arthritis, warmth, or erythema Extremities: no cyanosis, clubbing, or edema Neurologic: alert & oriented X3, cranial nerves II-XII intact, strength grossly intact, sensation intact to light touch, R handed  Creatinine, Ser  Date/Time Value Range Status  02/01/2012  5:20 AM 0.68  0.50 - 1.35 mg/dL Final  10/29/2128  8:65 PM 0.65  0.50 - 1.35 mg/dL Final  01/01/4695  2:95 PM 0.68  0.50 - 1.35 mg/dL Final  08/03/4130  4:40 AM 0.79  0.50 - 1.35 mg/dL Final  1/0/2725  3:66 PM 0.73  0.50 - 1.35 mg/dL Final    Results for orders placed during the hospital encounter of 01/29/12 (from the past 48 hour(s))  POTASSIUM     Status: Abnormal   Collection Time   01/30/12  9:33 PM      Component Value Range Comment   Potassium 3.4 (*) 3.5 - 5.1 mEq/L   POTASSIUM     Status: Normal   Collection Time   01/31/12  5:25 AM      Component Value Range Comment   Potassium 3.9  3.5 - 5.1 mEq/L   BASIC METABOLIC PANEL     Status: Abnormal   Collection Time   01/31/12  5:35 PM      Component Value Range Comment   Sodium 137  135 - 145 mEq/L    Potassium 4.0  3.5 - 5.1 mEq/L    Chloride 101  96 - 112 mEq/L    CO2 28  19 - 32 mEq/L    Glucose, Bld 83  70 - 99 mg/dL    BUN 3 (*) 6 - 23 mg/dL    Creatinine, Ser 4.40  0.50 - 1.35 mg/dL    Calcium 9.4  8.4 - 34.7 mg/dL    GFR calc non Af Amer >90  >90 mL/min    GFR calc Af Amer >90  >90 mL/min   PHOSPHORUS     Status: Normal   Collection Time   01/31/12  5:35 PM      Component Value Range Comment   Phosphorus 2.3  2.3 - 4.6 mg/dL   CK     Status: Abnormal   Collection Time   02/01/12  5:20 AM      Component Value Range Comment   Total CK 1069 (*) 7 - 232 U/L     BASIC METABOLIC PANEL     Status: Abnormal   Collection Time   02/01/12  5:20 AM      Component Value Range Comment   Sodium 137  135 - 145 mEq/L    Potassium 4.5  3.5 - 5.1 mEq/L    Chloride 101  96 - 112 mEq/L    CO2 28  19 - 32 mEq/L  Glucose, Bld 82  70 - 99 mg/dL    BUN 5 (*) 6 - 23 mg/dL    Creatinine, Ser 1.61  0.50 - 1.35 mg/dL    Calcium 9.4  8.4 - 09.6 mg/dL    GFR calc non Af Amer >90  >90 mL/min    GFR calc Af Amer >90  >90 mL/min   PHOSPHORUS     Status: Normal   Collection Time   02/01/12  5:20 AM      Component Value Range Comment   Phosphorus 2.8  2.3 - 4.6 mg/dL     No results found.   Assessment/Plan: The patient is a 41 yo man, no significant PMH, presenting with muscle weakness and pain, found to have hypokalemia of unknown etiology.  # Hypokalemia - The patient presents with severe hypokalemia <2, with a reported history of hypokalemia 15 years ago.  The etiology of this abnormality is unclear.  Previously proposed theories include hypokalemic periodic paralysis vs Bartter/Gitelman syndrome.  Now, potassium has normalized, but CK continues to slowly uptrend.  Urine potassium was appropriately low.  Urine chloride was not low.  Serum phosphorus was initially low.  TSH high-normal. -aldosterone/renin levels pending -check PTH (hypophosphatemia in bartter/gitelman can represent secondary hyperPTH) -monitor electrolytes daily -patient currently on K-dur 40 BID, with K = 4.5 today.  If K still normal tomorrow, d/c K-dur and observe  # Borderline Blood Pressure - no history of HTN (though limited interaction with healthcare system), with borderline elevated BP's during this admission. -continue to monitor -renin/aldosterone levels pending; may be helpful in choosing antihypertensive if needed  # Elevated CK - the patient presented with mildly elevated CK, which has continued to rise.  This may represent mild rhabdo vs ?autoimmune process (polymyositis).  Symptoms  appear to have significantly improved/resolved.  LFT's are not elevated -continue to monitor CK periodically -check ESR  Signed, Janalyn Harder 02/01/2012, 2:57 PM  I reviewed history with  Dustin Kirby.  We examined Dustin Kirby together.  He doesn't take laxatives or diuretics.  He has no hx of vomiting. Will send urine for thiazides and stool for phenolphthalein.   He's not hypertensive.  He is not acidotic.  Mg is not low.  K has been repleted with >500 mEq KCl since admission. TSH not high. Would continue po KCl He will get 72 mEq MgSO4 over next 12 hr. CK likely high due to low K and phosphorus.   If it looks like he has Bartter or Gitelman syndrome, I'd suggest he see Dustin Kirby [email or web address] who has a clinic (at [facility]) where she sees patients with these types of uncommon  tubular disorders.    Dustin Gravel, MD

## 2012-02-02 LAB — PHOSPHORUS: Phosphorus: 2.8 mg/dL (ref 2.3–4.6)

## 2012-02-02 LAB — CBC
Hemoglobin: 16.6 g/dL (ref 13.0–17.0)
MCH: 31.1 pg (ref 26.0–34.0)
MCV: 85.2 fL (ref 78.0–100.0)
RBC: 5.33 MIL/uL (ref 4.22–5.81)
WBC: 11.5 10*3/uL — ABNORMAL HIGH (ref 4.0–10.5)

## 2012-02-02 LAB — BASIC METABOLIC PANEL
BUN: 5 mg/dL — ABNORMAL LOW (ref 6–23)
Chloride: 100 mEq/L (ref 96–112)
GFR calc Af Amer: >90 mL/min (ref >90)
Glucose, Bld: 83 mg/dL (ref 70–99)
Potassium: 4.6 mEq/L (ref 3.5–5.1)
Sodium: 136 mEq/L (ref 135–145)

## 2012-02-02 LAB — MAGNESIUM: Magnesium: 3.9 mg/dL — ABNORMAL HIGH (ref 1.5–2.5)

## 2012-02-02 NOTE — Care Management Note (Signed)
    Page 1 of 1   02/02/2012     1:55:06 PM   CARE MANAGEMENT NOTE 02/02/2012  Patient:  Dustin Kirby, Dustin Kirby   Account Number:  [id number]  Date Initiated:  01/30/2012  Documentation initiated by:  Alvira Philips Assessment:   41 yr-old male adm with dx of hypokalemia; lives with family, independent PTA     Action/Plan:   Anticipated DC Date:  02/02/2012   Anticipated DC Plan:  HOME/SELF CARE      DC Planning Services  CM consult      Choice offered to / List presented to:             Status of service:  Completed, signed off Medicare Important Message given?   (If response is "NO", the following Medicare IM given date fields will be blank) Date Medicare IM given:   Date Additional Medicare IM given:    Discharge Disposition:  HOME/SELF CARE  Per UR Regulation:  Reviewed for med. necessity/level of care/duration of stay  If discussed at Long Length of Stay Meetings, dates discussed:    Comments:  Contact:  Anthonie Lotito, mother [phone]  02/02/12 13:53 Letha Cape RN, BSN [phone] patient lives at home with kids, pta independent, pateint has medication coverage and transportation.  Secretary made f/u apt for patient.  Patient for dc today.

## 2012-02-02 NOTE — Progress Notes (Signed)
[facility] Progress Note [place] TEAM 1 - Stepdown/ICU TEAM   Dustin Kirby LKG:[id number] DOB: May 27, 1971 DOA: 01/29/2012 PCP: [name], MD  Brief narrative: 41 year-old male with no significant past history who quit smoking 4 months ago had gone for a hike. Since then he has developed slowly generalized body aches and cramps which have been worsening to the point that he was not able to walk today and was brought to the ER. In the ER he was found to have severe hypokalemia with mildly elevated CK levels. Patient has been admitted for further management. His EKG shows U waves. Patient has difficulty moving both lower extremities. Denies any incontinence of urine or bowel. Denies chest pain shortness of breath nausea vomiting or diarrhea or abdominal pain. Patient states about 15 years ago he had an episode of shortness of breath and at that time he had muscle cramps and was told he had hypokalemia.  Assessment/Plan:  Hypokalemia -Appreciate renal assistance, will schedule appointment with Dr. Chinita Pester, hypokalemia continues to be stable. -Pending labs could probably be followed up as an outpatient. We'll discuss with renal.  Myositis / very mild Rhabdomyolysis -TSH within normal limits, CT improving today.  Severe dehydration This has been corrected with volume resuscitation  Code Status: Full Disposition Plan: Transfer to telemetry bed  Consultants: Renal  Procedures: None  Antibiotics: None  DVT prophylaxis: SCDs  HPI/Subjective: The patient is doing quite well today.  He reports no further cramps or muscle weakness.   Able to ambulate with out any difficulties.  Objective: Blood pressure 123/83, pulse 74, temperature 97.7 F (36.5 C), temperature source Oral, resp. rate 20, height 5\' 7"  (1.702 m), weight 76.1 kg (167 lb 12.3 oz), SpO2 100.00%.  Intake/Output Summary (Last 24 hours) at 02/02/12 0846 Last data filed at 02/02/12 0700  Gross per 24 hour    Intake 2560.83 ml  Output    600 ml  Net 1960.83 ml     Exam: General: No acute respiratory distress Lungs: Clear to auscultation bilaterally without wheezes or crackles Cardiovascular: Regular rate and rhythm without murmur gallop or rub  Abdomen: Nontender, nondistended, soft, bowel sounds positive, no rebound, no ascites, no appreciable mass Extremities: No significant cyanosis, clubbing, or edema bilateral lower extremities  Data Reviewed: Basic Metabolic Panel:  Lab 02/02/12 6644 02/01/12 1730 02/01/12 0520 01/31/12 1735 01/31/12 0525 01/30/12 1422 01/30/12 0415 01/29/12 2128  NA 136 137 137 137 -- 143 -- --  K 4.6 4.5 4.5 4.0 3.9 -- -- --  CL 100 98 101 101 -- 106 -- --  CO2 27 30 28 28  -- 29 -- --  GLUCOSE 83 99 82 83 -- 112* -- --  BUN 5* 6 5* 3* -- 5* -- --  CREATININE 0.68 0.74 0.68 0.65 -- 0.68 -- --  CALCIUM 9.1 9.8 9.4 9.4 -- 8.3* -- --  MG 3.9* -- -- -- -- -- 1.8 2.0  PHOS 2.8 -- 2.8 2.3 -- -- 1.1* --   Liver Function Tests:  Lab 01/30/12 0415  AST 28  ALT 17  ALKPHOS 67  BILITOT 0.5  PROT 6.3  ALBUMIN 3.4*   Cardiac Enzymes:  Lab 02/02/12 0625 02/01/12 0520 01/30/12 0415 01/29/12 2128  CKTOTAL 625* 1069* 614* 574*  CKMB -- -- 3.9 3.4  CKMBINDEX -- -- -- --  TROPONINI -- -- <0.30 --   Recent Results (from the past 240 hour(s))  MRSA PCR SCREENING     Status: Normal   Collection Time  01/30/12 12:31 AM      Component Value Range Status Comment   MRSA by PCR NEGATIVE  NEGATIVE Final     Studies:  Recent x-ray studies have been reviewed in detail by the Attending Physician  Scheduled Meds:  Reviewed in detail by the Attending Physician   Lonia Blood, MD [facility] Office  [phone] Pager [phone]  On-Call/Text Page:      [email or web address]      password [email or web address]  If 7PM-7AM, please contact night-coverage [email or web address] Password [email or web address] 02/02/2012, 8:46 AM   LOS: 4 days

## 2012-02-02 NOTE — Progress Notes (Signed)
Patient discharge teaching given, including activity, diet, follow-up appoints, and medications. Patient verbalized understanding of all discharge instructions. IV access was d/c'd. Vitals are stable. Skin is intact except as charted in most recent assessments. Pt to be escorted out by NT, to be driven home by family. 

## 2012-02-02 NOTE — Discharge Summary (Signed)
Physician Discharge Summary  Dustin Kirby UJW:[id number] DOB: 08-28-1970 DOA: 01/29/2012  PCP: [name], MD  Admit date: 01/29/2012 Discharge date: 02/02/2012  Discharge Diagnoses:  Principal Problem:  *Hypokalemia Active Problems:  Rhabdomyolysis  Dehydration  Hypokalemic periodic paralysis   Discharge Condition: Stable for discharge  Disposition:  Follow-up Information    Follow up with DEFAULT,PROVIDER, MD.   Contact information:   [address] [place] [address] [phone]       Follow up with [name], NT in 6 weeks. (sep 12.)    Contact information:   - [address] [place] [address]          Diet: Regular diet Wt Readings from Last 3 Encounters:  01/31/12 76.1 kg (167 lb 12.3 oz)    History of present illness:  41 year-old male with no significant past history who has recently quit smoking 4 months ago had gone for hiking with his family last Saturday 3 days ago. Since then he has developed slowly generalized body aches and cramps which has been worsening to the point that he was not able to walk today and was brought to the ER. In the ER he is found to have severe hypokalemia with mildly elevated CK levels. Patient has been admitted for further management. His EKG shows U waves. Patient has difficulty moving both lower extremities. This happened since today morning. Denies any incontinence of urine or bowel. Denies chest pain shortness of breath nausea vomiting or diarrhea or abdominal pain. Patient states about 15 years ago he had an episode of shortness of breath and at that time he had muscle cramps and was told he had hypokalemia.   Hospital Course:  Principal Problem: Hypokalemia/ Hypokalemic periodic paralysis: -The patient was admitted to the hospital was aggressively repleted, he is not acidotic, was not hypertensive, magnesium was not low. His potassium was repleted lower 500 mEq, also TSH was checked which was within normal  limits his potassium stabilized. 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 Patient just took them to followup at the [facility] nephrology clinic. And got him an appointment for 03/21/2012, as they see patient with these uncommon tubular disorder.  Rhabdomyolysis -This is probably secondary to severe hypokalemia, should you have any further aggressively this improved.  Dehydration -Does resolve with IV fluids.   Discharge Exam: Filed Vitals:   02/01/12 2238  BP: 123/83  Pulse: 74  Temp: 97.7 F (36.5 C)  Resp: 20   Filed Vitals:   01/31/12 2139 02/01/12 0537 02/01/12 1440 02/01/12 2238  BP: 147/87 113/73 134/84 123/83  Pulse: 79 73 85 74  Temp: 98.2 F (36.8 C) 98.2 F (36.8 C) 98.7 F (37.1 C) 97.7 F (36.5 C)  TempSrc: Oral Oral Oral Oral  Resp: 20 20 20 20   Height:      Weight:      SpO2: 98% 99% 98% 100%   See progress note  Discharge Instructions  Discharge Orders    Future Orders Please Complete By Expires   Diet - low sodium heart healthy      Increase activity slowly        Medication List  As of 02/02/2012  8:49 AM   TAKE these medications         cetirizine-pseudoephedrine 5-120 MG per tablet   Commonly known as: ZYRTEC-D   Take 1 tablet by mouth daily.  The results of significant diagnostics from this hospitalization (including imaging, microbiology, ancillary and laboratory) are listed below for reference.    Significant Diagnostic Studies: Dg Chest Port 1 View  01/30/2012  *RADIOLOGY REPORT*  Clinical Data: Shortness of breath.  PORTABLE CHEST - 1 VIEW  Comparison: None.  Findings: Heart and mediastinal contours are within normal limits. No focal opacities or effusions.  No acute bony abnormality.  IMPRESSION: No active cardiopulmonary disease.  Original Report Authenticated By: Cyndie Chime, M.D.    Microbiology: Recent Results (from the past 240 hour(s))  MRSA PCR SCREENING     Status:  Normal   Collection Time   01/30/12 12:31 AM      Component Value Range Status Comment   MRSA by PCR NEGATIVE  NEGATIVE Final      Labs: Basic Metabolic Panel:  Lab 02/02/12 1610 02/01/12 1730 02/01/12 0520 01/31/12 1735 01/30/12 1422 01/30/12 0415 01/29/12 2128  NA 136 137 137 137 143 -- --  K 4.6 4.5 -- -- -- -- --  CL 100 98 101 101 106 -- --  CO2 27 30 28 28 29  -- --  GLUCOSE 83 99 82 83 112* -- --  BUN 5* 6 5* 3* 5* -- --  CREATININE 0.68 0.74 0.68 0.65 0.68 -- --  CALCIUM 9.1 9.8 9.4 9.4 8.3* -- --  MG 3.9* -- -- -- -- 1.8 2.0  PHOS 2.8 -- 2.8 2.3 -- 1.1* --   Liver Function Tests:  Lab 01/30/12 0415  AST 28  ALT 17  ALKPHOS 67  BILITOT 0.5  PROT 6.3  ALBUMIN 3.4*   No results found for this basename: LIPASE:5,AMYLASE:5 in the last 168 hours No results found for this basename: AMMONIA:5 in the last 168 hours CBC:  Lab 02/02/12 0625  WBC 11.5*  NEUTROABS --  HGB 16.6  HCT 45.4  MCV 85.2  PLT 207   Cardiac Enzymes:  Lab 02/02/12 0625 02/01/12 0520 01/30/12 0415 01/29/12 2128  CKTOTAL 625* 1069* 614* 574*  CKMB -- -- 3.9 3.4  CKMBINDEX -- -- -- --  TROPONINI -- -- <0.30 --   BNP: No components found with this basename: POCBNP:5 CBG: No results found for this basename: GLUCAP:5 in the last 168 hours  Time coordinating discharge: Greater than 40 minutes  Signed:  Marinda Elk  [facility] 02/02/2012, 8:49 AM

## 2012-02-05 LAB — ALDOSTERONE + RENIN ACTIVITY W/ RATIO
ALDO / PRA Ratio: 14.7 Ratio (ref 0.9–28.9)
Aldosterone: 5 ng/dL

## 2020-01-29 ENCOUNTER — Emergency Department (HOSPITAL_BASED_OUTPATIENT_CLINIC_OR_DEPARTMENT_OTHER)
Admission: EM | Admit: 2020-01-29 | Discharge: 2020-01-29 | Discharge status: Against Medical Advice | Payer: Self-pay | Attending: Family Medicine | Admitting: Family Medicine

## 2020-01-29 ENCOUNTER — Encounter (HOSPITAL_BASED_OUTPATIENT_CLINIC_OR_DEPARTMENT_OTHER): Payer: Self-pay | Admitting: *Deleted

## 2020-01-29 ENCOUNTER — Other Ambulatory Visit: Payer: Self-pay

## 2020-01-29 ENCOUNTER — Emergency Department (HOSPITAL_BASED_OUTPATIENT_CLINIC_OR_DEPARTMENT_OTHER): Payer: Self-pay

## 2020-01-29 DIAGNOSIS — Z20822 Contact with and (suspected) exposure to covid-19: Secondary | ICD-10-CM | POA: Insufficient documentation

## 2020-01-29 DIAGNOSIS — I214 Non-ST elevation (NSTEMI) myocardial infarction: Secondary | ICD-10-CM | POA: Diagnosis present

## 2020-01-29 DIAGNOSIS — F17210 Nicotine dependence, cigarettes, uncomplicated: Secondary | ICD-10-CM | POA: Insufficient documentation

## 2020-01-29 LAB — BASIC METABOLIC PANEL
Anion gap: 9 (ref 5–15)
BUN: 10 mg/dL (ref 6–20)
CO2: 28 mmol/L (ref 22–32)
Calcium: 10.2 mg/dL (ref 8.9–10.3)
Chloride: 100 mmol/L (ref 98–111)
Creatinine, Ser: 0.88 mg/dL (ref 0.61–1.24)
GFR calc Af Amer: >60 mL/min (ref >60)
GFR calc non Af Amer: >60 mL/min (ref >60)
Glucose, Bld: 135 mg/dL — ABNORMAL HIGH (ref 70–99)
Potassium: 3.3 mmol/L — ABNORMAL LOW (ref 3.5–5.1)
Sodium: 137 mmol/L (ref 135–145)

## 2020-01-29 LAB — CBC
HCT: 47.8 % (ref 39.0–52.0)
Hemoglobin: 17.3 g/dL — ABNORMAL HIGH (ref 13.0–17.0)
MCH: 31.1 pg (ref 26.0–34.0)
MCHC: 36.2 g/dL — ABNORMAL HIGH (ref 30.0–36.0)
MCV: 86 fL (ref 80.0–100.0)
Platelets: 221 10*3/uL (ref 150–400)
RBC: 5.56 MIL/uL (ref 4.22–5.81)
RDW: 12.6 % (ref 11.5–15.5)
WBC: 9.8 10*3/uL (ref 4.0–10.5)
nRBC: 0 % (ref 0.0–0.2)

## 2020-01-29 LAB — PROTIME-INR
INR: 0.9 (ref 0.8–1.2)
Prothrombin Time: 12.2 seconds (ref 11.4–15.2)

## 2020-01-29 LAB — TROPONIN I (HIGH SENSITIVITY)
Troponin I (High Sensitivity): 9 ng/L (ref <18)
Troponin I (High Sensitivity): 19 ng/L — ABNORMAL HIGH (ref <18)
Troponin I (High Sensitivity): 17 ng/L (ref <18)

## 2020-01-29 LAB — SARS CORONAVIRUS 2 BY RT PCR (HOSPITAL ORDER, PERFORMED IN ~~LOC~~ HOSPITAL LAB): SARS Coronavirus 2: NEGATIVE

## 2020-01-29 LAB — D-DIMER, QUANTITATIVE: D-Dimer, Quant: 0.42 ug/mL-FEU (ref 0.00–0.50)

## 2020-01-29 LAB — APTT: aPTT: 27 seconds (ref 24–36)

## 2020-01-29 MED ORDER — ASPIRIN 81 MG PO CHEW
324.0000 mg | CHEWABLE_TABLET | Freq: Once | ORAL | Status: AC
  Administered 2020-01-29: 324 mg via ORAL
  Filled 2020-01-29: qty 4

## 2020-01-29 MED ORDER — HEPARIN (PORCINE) 25000 UT/250ML-% IV SOLN
1000.0000 [IU]/h | INTRAVENOUS | Status: DC
  Administered 2020-01-29: 1000 [IU]/h via INTRAVENOUS
  Filled 2020-01-29: qty 250

## 2020-01-29 MED ORDER — HEPARIN BOLUS VIA INFUSION
4000.0000 [IU] | Freq: Once | INTRAVENOUS | Status: AC
  Administered 2020-01-29: 4000 [IU] via INTRAVENOUS

## 2020-01-29 MED ORDER — SODIUM CHLORIDE 0.9% FLUSH
3.0000 mL | Freq: Once | INTRAVENOUS | Status: DC
  Filled 2020-01-29: qty 3

## 2020-01-29 MED ORDER — POTASSIUM CHLORIDE CRYS ER 20 MEQ PO TBCR
40.0000 meq | EXTENDED_RELEASE_TABLET | Freq: Once | ORAL | Status: AC
  Administered 2020-01-29: 40 meq via ORAL
  Filled 2020-01-29: qty 2

## 2020-01-29 MED ORDER — SODIUM CHLORIDE 0.9 % IV BOLUS
1000.0000 mL | Freq: Once | INTRAVENOUS | Status: AC
  Administered 2020-01-29: 1000 mL via INTRAVENOUS

## 2020-01-29 MED ORDER — NITROGLYCERIN 0.4 MG SL SUBL
0.4000 mg | SUBLINGUAL_TABLET | SUBLINGUAL | Status: DC | PRN
  Administered 2020-01-29 (×2): 0.4 mg via SUBLINGUAL
  Filled 2020-01-29: qty 1

## 2020-01-29 NOTE — ED Notes (Signed)
Pt. Reports he had a potassium drop 8 years ago and almost went into cardiac arrest.  Pt. Was admitted to [facility] for a week.  Pt. Today is 3.3

## 2020-01-29 NOTE — ED Provider Notes (Signed)
Patient upon arrival of [facility] decided he did not want to be admitted for his non-STEMI.  Discussed situation with patient stating that cardiology had recommended admission the concern would have been for a heart attack and needed serial enzymes SY was started on the heparin.  Made very clear to patient that going home could result in death.  Patient stated he did not care he needed to go home.  So patient will sign out AMA.  Patient given a referral to cardiology.   Vanetta Mulders, MD 01/29/20 [phone]

## 2020-01-29 NOTE — ED Notes (Signed)
Pt. Reports no pain in the L arm and no chest pain.

## 2020-01-29 NOTE — ED Notes (Signed)
Pt. Will be going home AMA due to his choice.  Pt. Was spoken to by RNs and by EDP Dr. Loletha Grayer and by RN [place] and Earlene Plater and [place] RNs

## 2020-01-29 NOTE — Care Plan (Signed)
49 year old gentleman with apparently no known medical history but currently smoker presented to ED with a complaint of multiple symptoms.  He was in a freezer, very usual for him, when he came out, he started having some shortness of breath and soon after that he started having some pressure-like pain which radiated to the left arm.  He came to the ED for that.  Initial EKG showed some T wave changes and later EKG showed flattening of the T wave.  First troponin was 9 followed by 19.  Per ED physician, he ruled in for NSTEMI based on the troponin.  ED physician already spoke to cardiologist who recommended further admission and starting on a heparin which he has been started on.  Patient has been accepted for admission to [facility] as observation and cardiac telemetry.  Cardiology will consult however please touch base with cardiology once patient arrives to [facility] to make them aware.

## 2020-01-29 NOTE — ED Provider Notes (Signed)
[facility] EMERGENCY DEPARTMENT Provider Note   CSN: [id number] Arrival date & time: 01/29/20  1045     History Chief Complaint  Patient presents with  . Shortness of Breath    Dustin Kirby is a 49 y.o. male.  HPI 49 year old male presents with acute dyspnea and left arm pain. He was in a freezer at work for about an hour and then came out (pretty typical for him at work). When he came out, he felt dyspnea, and then some left chest pressure and left arm pain. The pain shoots down the left arm. It felt like his heart was racing. No numbness. Some coughing at onset as well.  No preceding illness before this, such as fever or cough. Right now he feels better, including no chest pain, though still has some left arm soreness.   He has not seen a doctor in a long time. No known PMH. No family history of early CAD. He smokes.    Past Medical History:  Diagnosis Date  . No pertinent past medical history     Patient Active Problem List   Diagnosis Date Noted  . NSTEMI (non-ST elevated myocardial infarction) (HCC) 01/29/2020  . 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 Hypokalemia 01/29/2012  . Rhabdomyolysis 01/29/2012  . Dehydration 01/29/2012    Past Surgical History:  Procedure Laterality Date  . APPENDECTOMY    . HERNIA REPAIR         Family History  Problem Relation Age of Onset  . Hypertension Mother     Social History   Tobacco Use  . Smoking status: Current Every Day Smoker    Types: Cigarettes    Last attempt to quit: 09/25/2011    Years since quitting: 8.3  . Smokeless tobacco: Never Used  . Tobacco comment: using electronical cigs now  Substance Use Topics  . Alcohol use: No  . Drug use: No    Home Medications Prior to Admission medications   Medication Sig Start Date End Date Taking? Authorizing Provider  cetirizine-pseudoephedrine (ZYRTEC-D) 5-120 MG per tablet Take 1 tablet by mouth daily.    [provider]    Allergies      Patient has no known allergies.  Review of Systems   Review of Systems  Constitutional: Negative for fever.  Respiratory: Positive for shortness of breath. Negative for cough.   Cardiovascular: Positive for chest pain.  Gastrointestinal: Negative for abdominal pain and vomiting.  Musculoskeletal: Positive for myalgias.  Neurological: Negative for weakness and numbness.  All other systems reviewed and are negative.   Physical Exam Updated Vital Signs BP (!) 176/100 (BP Location: Right Arm)   Pulse 94   Temp 97.8 F (36.6 C) (Oral)   Resp 19   Ht 5\' 7"  (1.702 m)   Wt 81.6 kg   SpO2 100%   BMI 28.19 kg/m   Physical Exam Vitals and nursing note reviewed.  Constitutional:      General: He is not in acute distress.    Appearance: He is well-developed. He is not ill-appearing or diaphoretic.  HENT:     Head: Normocephalic and atraumatic.     Right Ear: External ear normal.     Left Ear: External ear normal.     Nose: Nose normal.  Eyes:     General:        Right eye: No discharge.        Left eye: No discharge.  Cardiovascular:     Rate  and Rhythm: Regular rhythm. Tachycardia present.     Pulses:          Radial pulses are 2+ on the right side and 2+ on the left side.     Heart sounds: Normal heart sounds.     Comments: HR~100 Pulmonary:     Effort: Pulmonary effort is normal. No tachypnea or accessory muscle usage.     Breath sounds: Normal breath sounds. No decreased breath sounds, wheezing, rhonchi or rales.  Abdominal:     Palpations: Abdomen is soft.     Tenderness: There is no abdominal tenderness.  Musculoskeletal:     Cervical back: Neck supple.     Comments: No tenderness in left upper extremity  Skin:    General: Skin is warm and dry.  Neurological:     Mental Status: He is alert.  Psychiatric:        Mood and Affect: Mood is not anxious.     ED Results / Procedures / Treatments   Labs (all labs ordered are listed, but only abnormal results are  displayed) Labs Reviewed  BASIC METABOLIC PANEL - Abnormal; Notable for the following components:      Result Value   Potassium 3.3 (*)    Glucose, Bld 135 (*)    All other components within normal limits  CBC - Abnormal; Notable for the following components:   Hemoglobin 17.3 (*)    MCHC 36.2 (*)    All other components within normal limits  TROPONIN I (HIGH SENSITIVITY) - Abnormal; Notable for the following components:   Troponin I (High Sensitivity) 19 (*)    All other components within normal limits  SARS CORONAVIRUS 2 BY RT PCR (HOSPITAL ORDER, PERFORMED IN [facility])  D-DIMER, QUANTITATIVE (NOT AT [facility])  APTT  PROTIME-INR  HEPARIN LEVEL (UNFRACTIONATED)  TROPONIN I (HIGH SENSITIVITY)    EKG EKG Interpretation  Date/Time:  Thursday January 29 2020 10:53:37 EDT Ventricular Rate:  113 PR Interval:  138 QRS Duration: 90 QT Interval:  320 QTC Calculation: 438 R Axis:   57 Text Interpretation: Sinus tachycardia nonspecific T waves Rate is faster compared to 2013 Confirmed by Pricilla Loveless [phone]) on 01/29/2020 11:23:23 AM   EKG Interpretation  Date/Time:  Thursday January 29 2020 14:29:13 EDT Ventricular Rate:  91 PR Interval:  138 QRS Duration: 95 QT Interval:  349 QTC Calculation: 430 R Axis:   71 Text Interpretation: Sinus rhythm no acute ST/T changes flattened T waves appear a little improved compared to earlier in the day Confirmed by Pricilla Loveless [phone]) on 01/29/2020 2:33:22 PM       Radiology DG Chest 2 View  Result Date: 01/29/2020 CLINICAL DATA:  Left arm pain, shortness of breath EXAM: CHEST - 2 VIEW COMPARISON:  01/30/2012 FINDINGS: The heart size and mediastinal contours are within normal limits. Both lungs are clear. The visualized skeletal structures are unremarkable. IMPRESSION: Negative. Electronically Signed   By: Charlett Nose M.D.   On: 01/29/2020 11:17    Procedures .Critical Care Performed by: Pricilla Loveless, MD Authorized by:  Pricilla Loveless, MD   Critical care provider statement:    Critical care time (minutes):  35   Critical care time was exclusive of:  Separately billable procedures and treating other patients   Critical care was necessary to treat or prevent imminent or life-threatening deterioration of the following conditions:  Cardiac failure   Critical care was time spent personally by me on the following activities:  Discussions with  consultants, evaluation of patient's response to treatment, examination of patient, ordering and performing treatments and interventions, ordering and review of laboratory studies, ordering and review of radiographic studies, pulse oximetry, re-evaluation of patient's condition, obtaining history from patient or surrogate and review of old charts   (including critical care time)  Medications Ordered in ED Medications  sodium chloride flush (NS) 0.9 % injection 3 mL (3 mLs Intravenous Not Given 01/29/20 1311)  aspirin chewable tablet 324 mg (has no administration in time range)  nitroGLYCERIN (NITROSTAT) SL tablet 0.4 mg (has no administration in time range)  heparin bolus via infusion 4,000 Units (has no administration in time range)  heparin ADULT infusion 100 units/mL (25000 units/234mL sodium chloride 0.45%) (has no administration in time range)  sodium chloride 0.9 % bolus 1,000 mL (1,000 mLs Intravenous New Bag/Given 01/29/20 1308)  potassium chloride SA (KLOR-CON) CR tablet 40 mEq (40 mEq Oral Given 01/29/20 1321)    ED Course  I have reviewed the triage vital signs and the nursing notes.  Pertinent labs & imaging results that were available during my care of the patient were reviewed by me and considered in my medical decision making (see chart for details).  Clinical Course as of Jan 29 1452  Thu Jan 29, 2020  1439 I discussed case/results with Dr. Mayford Knife of cardiology. She recommends heparin, cycling troponins, and hospitalist admit. Cardiology will consult.    [SG]      Clinical Course User Index [SG] Pricilla Loveless, MD   MDM Rules/Calculators/A&P HEAR Score: 4                        Patient presents with acute/transient chest pain/shortness of breath.  He still has some residual left arm pain but no focal tenderness or swelling.  Neurovascularly intact.  Troponins did rise a little bit and technically are now abnormal.  As above, Dr. Mayford Knife is recommending ACS work-up and hospitalist admission.  Discussed with Dr. Jacqulyn Bath, who will admit.  I will give aspirin and given nitro to see if this relieves his arm symptoms. Final Clinical Impression(s) / ED Diagnoses Final diagnoses:  NSTEMI (non-ST elevated myocardial infarction) [facility])    Rx / DC Orders ED Discharge Orders    None       Pricilla Loveless, MD 01/29/20 1455

## 2020-01-29 NOTE — Discharge Instructions (Addendum)
Please schedule an appointment to follow-up with cardiology.  As discussed your presentation and labs warranted admission to rule out significant cardiac event.  Was concerning possibly for heart attack.  Cardiology did agree with the admission.  Also you understand that going home could result in dying.

## 2020-01-29 NOTE — ED Notes (Signed)
Patient denies pain and is resting comfortably.  

## 2020-01-29 NOTE — ED Notes (Signed)
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  Pt. Said he felt dizzy and could not move he sat down had a lady from work drive him to the ED.

## 2020-01-29 NOTE — Progress Notes (Signed)
ANTICOAGULATION CONSULT NOTE - Initial Consult  Pharmacy Consult for heparin Indication: chest pain/ACS  No Known Allergies  Patient Measurements: Height: 5\' 7"  (170.2 cm) Weight: 81.6 kg (180 lb) IBW/kg (Calculated) : 66.1 Heparin Dosing Weight: 81.6 kg  Vital Signs: Temp: 97.8 F (36.6 C) (08/05 1057) Temp Source: Oral (08/05 1057) BP: 176/100 (08/05 1321) Pulse Rate: 94 (08/05 1321)  Labs: Recent Labs    01/29/20 1120 01/29/20 1322  HGB 17.3*  --   HCT 47.8  --   PLT 221  --   CREATININE 0.88  --   TROPONINIHS 9 19*    Estimated Creatinine Clearance: 105 mL/min (by C-G formula based on SCr of 0.88 mg/dL).   Medical History: Past Medical History:  Diagnosis Date  . No pertinent past medical history     Medications:  Infusions:  . heparin      Assessment: new onset SOB, L arm pain, possible ACS   Goal of Therapy:  Heparin level 0.3-0.7 units/ml Monitor platelets by anticoagulation protocol: Yes   Plan:  Give 4000 units bolus x 1 Start heparin infusion at 1000 units/hr Check anti-Xa level in 6 hours and daily while on heparin Continue to monitor H&H and platelets  03/30/20 A Shaquana Buel 01/29/2020,2:55 PM

## 2020-01-29 NOTE — ED Triage Notes (Signed)
Dizziness, shaky, sob and left arm pain 20 minutes ago while at work.

## 2020-02-01 ENCOUNTER — Other Ambulatory Visit: Payer: Self-pay

## 2020-02-01 ENCOUNTER — Emergency Department (HOSPITAL_COMMUNITY): Payer: Self-pay

## 2020-02-01 ENCOUNTER — Encounter (HOSPITAL_COMMUNITY): Payer: Self-pay | Admitting: Emergency Medicine

## 2020-02-01 ENCOUNTER — Emergency Department (HOSPITAL_COMMUNITY)
Admission: EM | Admit: 2020-02-01 | Discharge: 2020-02-01 | Disposition: A | Discharge status: Home / Self Care | Payer: Self-pay | Attending: Emergency Medicine | Admitting: Emergency Medicine

## 2020-02-01 DIAGNOSIS — R079 Chest pain, unspecified: Secondary | ICD-10-CM | POA: Insufficient documentation

## 2020-02-01 DIAGNOSIS — M5431 Sciatica, right side: Secondary | ICD-10-CM | POA: Insufficient documentation

## 2020-02-01 DIAGNOSIS — I10 Essential (primary) hypertension: Secondary | ICD-10-CM | POA: Insufficient documentation

## 2020-02-01 DIAGNOSIS — F17210 Nicotine dependence, cigarettes, uncomplicated: Secondary | ICD-10-CM | POA: Insufficient documentation

## 2020-02-01 DIAGNOSIS — Z20822 Contact with and (suspected) exposure to covid-19: Secondary | ICD-10-CM | POA: Insufficient documentation

## 2020-02-01 LAB — BASIC METABOLIC PANEL
Anion gap: 11 (ref 5–15)
BUN: 9 mg/dL (ref 6–20)
CO2: 22 mmol/L (ref 22–32)
Calcium: 9.4 mg/dL (ref 8.9–10.3)
Chloride: 102 mmol/L (ref 98–111)
Creatinine, Ser: 0.74 mg/dL (ref 0.61–1.24)
GFR calc Af Amer: >60 mL/min (ref >60)
GFR calc non Af Amer: >60 mL/min (ref >60)
Glucose, Bld: 90 mg/dL (ref 70–99)
Potassium: 3.7 mmol/L (ref 3.5–5.1)
Sodium: 135 mmol/L (ref 135–145)

## 2020-02-01 LAB — CBC
HCT: 49.9 % (ref 39.0–52.0)
Hemoglobin: 17.9 g/dL — ABNORMAL HIGH (ref 13.0–17.0)
MCH: 31.2 pg (ref 26.0–34.0)
MCHC: 35.9 g/dL (ref 30.0–36.0)
MCV: 86.9 fL (ref 80.0–100.0)
Platelets: 254 10*3/uL (ref 150–400)
RBC: 5.74 MIL/uL (ref 4.22–5.81)
RDW: 12.4 % (ref 11.5–15.5)
WBC: 11.8 10*3/uL — ABNORMAL HIGH (ref 4.0–10.5)
nRBC: 0 % (ref 0.0–0.2)

## 2020-02-01 LAB — TROPONIN I (HIGH SENSITIVITY)
Troponin I (High Sensitivity): 4 ng/L (ref <18)
Troponin I (High Sensitivity): 5 ng/L (ref <18)

## 2020-02-01 LAB — SARS CORONAVIRUS 2 BY RT PCR (HOSPITAL ORDER, PERFORMED IN ~~LOC~~ HOSPITAL LAB): SARS Coronavirus 2: NEGATIVE

## 2020-02-01 MED ORDER — ASPIRIN 81 MG PO CHEW
81.0000 mg | CHEWABLE_TABLET | Freq: Every day | ORAL | 0 refills | Status: DC
Start: 2020-02-01 — End: 2023-09-10

## 2020-02-01 MED ORDER — IOHEXOL 350 MG/ML SOLN
100.0000 mL | Freq: Once | INTRAVENOUS | Status: AC | PRN
  Administered 2020-02-01: 100 mL via INTRAVENOUS

## 2020-02-01 MED ORDER — SODIUM CHLORIDE 0.9 % IV BOLUS
500.0000 mL | Freq: Once | INTRAVENOUS | Status: AC
  Administered 2020-02-01: 500 mL via INTRAVENOUS

## 2020-02-01 MED ORDER — ASPIRIN 81 MG PO CHEW
324.0000 mg | CHEWABLE_TABLET | Freq: Once | ORAL | Status: AC
  Administered 2020-02-01: 324 mg via ORAL
  Filled 2020-02-01: qty 4

## 2020-02-01 MED ORDER — METHOCARBAMOL 500 MG PO TABS
500.0000 mg | ORAL_TABLET | Freq: Three times a day (TID) | ORAL | 0 refills | Status: DC
Start: 2020-02-01 — End: 2023-09-10

## 2020-02-01 MED ORDER — LISINOPRIL 20 MG PO TABS: 20.0000 mg | ORAL_TABLET | Freq: Every day | ORAL | 0 refills | Status: AC

## 2020-02-01 MED ORDER — LABETALOL HCL 5 MG/ML IV SOLN
20.0000 mg | Freq: Once | INTRAVENOUS | Status: AC
  Administered 2020-02-01: 20 mg via INTRAVENOUS
  Filled 2020-02-01: qty 4

## 2020-02-01 MED ORDER — HYDROMORPHONE HCL 1 MG/ML IJ SOLN
1.0000 mg | Freq: Once | INTRAMUSCULAR | Status: AC
  Administered 2020-02-01: 1 mg via INTRAVENOUS
  Filled 2020-02-01: qty 1

## 2020-02-01 MED ORDER — LIDOCAINE 5 % EX PTCH: 1.0000 | MEDICATED_PATCH | CUTANEOUS | 0 refills | Status: DC

## 2020-02-01 MED ORDER — LISINOPRIL 10 MG PO TABS
20.0000 mg | ORAL_TABLET | Freq: Every day | ORAL | Status: DC
  Administered 2020-02-01: 20 mg via ORAL
  Filled 2020-02-01: qty 2

## 2020-02-01 NOTE — Discharge Instructions (Addendum)
At this time there does not appear to be the presence of an emergent medical condition, however there is always the potential for conditions to change. Please read and follow the below instructions.  Please return to the Emergency Department immediately for any new or worsening symptoms or if your symptoms return. Please be sure to follow up with your Primary Care Provider within one week regarding your visit today; please call their office to schedule an appointment even if you are feeling better for a follow-up visit. Begin taking the blood pressure medication lisinopril as prescribed.  Follow-up with your primary care doctor for blood pressure recheck this week and medication management.  Begin taking the baby aspirin daily to lower your risk for heart disease. You were referred to [facility] medical cardiology [facility], call their office to confirm your appointment tomorrow morning. You may use the muscle relaxer Robaxin as prescribed to help with your symptoms.  Do not drive or operate heavy machinery while taking Robaxin as it will make you drowsy.  Do not drink alcohol or take other sedating medications while taking Robaxin as this will worsen side effects. You may use the Lidoderm patch as prescribed to help with your symptoms.  Lidoderm may be expensive so you may speak with your pharmacist about finding over-the-counter medications that work similarly.  Get help right away if: Your chest pain returns. You have a cough that gets worse, or you cough up blood. You have very bad (severe) pain in your belly (abdomen). You pass out (faint). You have either of these for no clear reason: Sudden chest discomfort. Sudden discomfort in your arms, back, neck, or jaw. You have shortness of breath at any time. You suddenly start to sweat, or your skin gets clammy. You feel sick to your stomach (nauseous). You throw up (vomit). You suddenly feel lightheaded or dizzy. You cannot control when you pee  (urinate) or poop (have a bowel movement). You have weakness in any of these areas and it gets worse: Lower back. The area between your hip bones. Butt. Legs. You have redness or swelling of your back. You have a burning feeling when you pee. You feel very weak or tired. Your heart starts to beat fast, or it feels like it is skipping beats. These symptoms may be an emergency. Do not wait to see if the symptoms will go away. Get medical help right away. Call your local emergency services (911 in the [place] Do not drive yourself to the hospital.  Please read the additional information packets attached to your discharge summary.  Do not take your medicine if  develop an itchy rash, swelling in your mouth or lips, or difficulty breathing; call 911 and seek immediate emergency medical attention if this occurs.  You may review your lab tests and imaging results in their entirety on your MyChart account.  Please discuss all results of fully with your primary care provider and other specialist at your follow-up visit.  Note: Portions of this text may have been transcribed using voice recognition software. Every effort was made to ensure accuracy; however, inadvertent computerized transcription errors may still be present.

## 2020-02-01 NOTE — ED Provider Notes (Signed)
[facility] EMERGENCY DEPARTMENT Provider Note   CSN: [id number] Arrival date & time: 02/01/20  1331     History Chief Complaint  Patient presents with  . Hypertension    Dustin Kirby is a 49 y.o. male history of NSTEMI, appendectomy.  Patient presents today primarily for right lower back/buttock and leg pain onset 1-2 weeks ago describes severe shooting pain constant worsened with movement and palpation no alleviating factors.  Second concern is left-sided chest pain radiating to the left shoulder.  He was admitted for NSTEMI 2 days ago but left AMA.  He reports he was given 1 nitro in route by EMS for his symptoms.  Patient reports he does not take any medications on a daily basis for blood pressure.  Associated symptom of numb sensation of the right leg.  He denies fall/injury, fever/chills, headache, vision change, cough/hemoptysis, shortness of breath, extremity swelling/color change, dysuria/hematuria, testicular pain/swelling, saddle area paresthesias, bowel/bladder incontinence, urinary retention or any additional concerns.  HPI     Past Medical History:  Diagnosis Date  . No pertinent past medical history     Patient Active Problem List   Diagnosis Date Noted  . NSTEMI (non-ST elevated myocardial infarction) (HCC) 01/29/2020  . 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 Hypokalemia 01/29/2012  . Rhabdomyolysis 01/29/2012  . Dehydration 01/29/2012    Past Surgical History:  Procedure Laterality Date  . APPENDECTOMY    . HERNIA REPAIR         Family History  Problem Relation Age of Onset  . Hypertension Mother     Social History   Tobacco Use  . Smoking status: Current Every Day Smoker    Types: Cigarettes    Last attempt to quit: 09/25/2011    Years since quitting: 8.3  . Smokeless tobacco: Never Used  . Tobacco comment: using electronical cigs now  Substance Use Topics  . Alcohol use: No  . Drug use: No    Home Medications Prior to Admission  medications   Medication Sig Start Date End Date Taking? Authorizing Provider  cetirizine-pseudoephedrine (ZYRTEC-D) 5-120 MG per tablet Take 1 tablet by mouth daily.    [provider]    Allergies    Patient has no known allergies.  Review of Systems   Review of Systems Ten systems are reviewed and are negative for acute change except as noted in the HPI  Physical Exam Updated Vital Signs BP (!) 209/112   Pulse 100   Temp 98.4 F (36.9 C) (Oral)   Resp (!) 24   Ht 5\' 7"  (1.702 m)   Wt 81.6 kg   SpO2 97%   BMI 28.19 kg/m   Physical Exam Constitutional:      General: He is not in acute distress.    Appearance: Normal appearance. He is well-developed. He is not ill-appearing or diaphoretic.  HENT:     Head: Normocephalic and atraumatic.     Right Ear: External ear normal.     Left Ear: External ear normal.  Eyes:     General: Vision grossly intact. Gaze aligned appropriately.     Pupils: Pupils are equal, round, and reactive to light.  Neck:     Trachea: Trachea and phonation normal.  Cardiovascular:     Pulses:          Dorsalis pedis pulses are 2+ on the right side and 2+ on the left side.  Pulmonary:     Effort: Pulmonary effort is normal. No respiratory distress.  Abdominal:     General: There is no distension.     Palpations: Abdomen is soft.     Tenderness: There is no abdominal tenderness. There is no guarding or rebound.  Musculoskeletal:        General: Normal range of motion.     Cervical back: Normal range of motion.       Legs:     Comments: No midline C/T/L spinal tenderness to palpation, no paraspinal muscle tenderness, no deformity, crepitus, or step-off noted. No sign of injury to the neck or back. - Tenderness to palpation of the right gluteal musculature without overlying skin change.  Positive right straight leg raise.  Feet:     Right foot:     Protective Sensation: 3 sites tested. 3 sites sensed.     Left foot:     Protective  Sensation: 3 sites tested. 3 sites sensed.  Skin:    General: Skin is warm and dry.  Neurological:     Mental Status: He is alert.     GCS: GCS eye subscore is 4. GCS verbal subscore is 5. GCS motor subscore is 6.     Comments: Speech is clear and goal oriented, follows commands Major Cranial nerves without deficit, no facial droop Normal strength in upper and lower extremities bilaterally including dorsiflexion and plantar flexion, strong and equal grip strength Sensation normal to light and sharp touch Moves extremities without ataxia, coordination intact No clonus of the feet.  Psychiatric:        Behavior: Behavior normal.    ED Results / Procedures / Treatments   Labs (all labs ordered are listed, but only abnormal results are displayed) Labs Reviewed  BASIC METABOLIC PANEL  CBC  I-STAT CHEM 8, ED  TROPONIN I (HIGH SENSITIVITY)    EKG EKG Interpretation  Date/Time:  Sunday February 01 2020 14:06:03 EDT Ventricular Rate:  90 PR Interval:    QRS Duration: 95 QT Interval:  344 QTC Calculation: 421 R Axis:   72 Text Interpretation: Sinus rhythm Borderline ST elevation, anterior leads No significant changes from prior tracing, No STEMI Confirmed by Alvester Chou [id number]) on 02/01/2020 3:29:09 PM   Radiology DG Chest Port 1 View  Result Date: 02/01/2020 CLINICAL DATA:  Hypertension EXAM: PORTABLE CHEST 1 VIEW COMPARISON:  None. FINDINGS: Normal mediastinum and cardiac silhouette. Normal pulmonary vasculature. No evidence of effusion, infiltrate, or pneumothorax. No acute bony abnormality. IMPRESSION: No acute cardiopulmonary process. Electronically Signed   By: Genevive Bi M.D.   On: 02/01/2020 15:09    Procedures Procedures (including critical care time)  Medications Ordered in ED Medications  HYDROmorphone (DILAUDID) injection 1 mg (has no administration in time range)  aspirin chewable tablet 324 mg (has no administration in time range)  labetalol (NORMODYNE)  injection 20 mg (has no administration in time range)    ED Course  I have reviewed the triage vital signs and the nursing notes.  Pertinent labs & imaging results that were available during my care of the patient were reviewed by me and considered in my medical decision making (see chart for details).  Clinical Course as of Jan 31 1917  Wynelle Link Feb 01, 2020  1557 This is a 49 year old male who does not follow with doctors, presented to emergency department with chest pain and right lower extremity pain.  Reports he has been having intermittent symptoms for the past 2 or 3 weeks.  He describes a pressure and sharp pain in his chest that he also  feels in his left shoulder and left arm, which occurred with exertion at work on Thursday, but also occurred today while he was completely at rest.  He says he has had smaller episodes or not as intense.  EMS gave the patient aspirin in route.  He says his pain is gradually resolved on its own and is asymptomatic now with the chest and shoulder pain.  However he is also describing is had about 2 to 3 weeks of pain in his right lower hip and his right upper leg, associated with some numbness in the leg.  He is not sure if he pulled something in his back.  He denies any significant family history of MI.  He denies any known history of hypertension, diabetes, high cholesterol, but says he has not seen a doctor many years.  He has never had a cardiac work-up.  On exam the patient is resting comfortably.  His heart rate is in the 90s and regular.  His lungs are clear.  He has excellent equal bilateral extremity pulses.  He is hypertensive with blood pressure 170 systolic after receiving IV labetalol.  He is afebrile.  His initial labs show troponin of 4, unremarkable BMP, CBC with some elevated hemoglobin 17.9.  Chest x-ray per my read shows no focal infiltrates.  He is pending dissection study given the radiation of his symptoms to his shoulder and his right lower leg.   Following this we will need to reassess his cardiac risk and disposition.  His EKG at this time shows no acute ischemia.   [MT]  1600 Lower suspicion for PE with intermittent symptoms, no clear risk factors per history or exam.  He has not received covid vaccine - we'll test here as well   [MT]    Clinical Course User Index [MT] Trifan, Kermit Balo, MD   MDM Rules/Calculators/A&P                         Additional history obtained from: 1. Nursing notes from this visit. 2. Review of electronic medical record, reviewed patient's ED encounter from 01/29/2020.  Diagnosis of NSTEMI.  Troponin had increased from 9 to 19.  He was started on heparin and admitted to hospitalist service but left AMA prior to transfer. -------------------------------- 49 year old male arrived for left-sided chest pain, right leg pain and numbness.  Significantly hypertensive on arrival.  Given neurologic complaint and chest pain there was concern for dissection and dissection study was ordered.  Labetalol ordered for blood pressure control, Dilaudid ordered for pain. - CBC shows mild ketosis of 11.8, elevated hemoglobin of 17.9. COVID-19 test negative. BMP shows no emergent electrolyte derangement, AKI or gap. Initial and delta high-sensitivity troponin within normal limits (4-->5)  CXR:  IMPRESSION:  No acute cardiopulmonary process.   CT C/A/P Dissection Study:  IMPRESSION:  1. Normal contour and caliber of the thoracic and abdominal aorta.  No evidence of aneurysm, dissection, or other acute aortic  pathology. No significant atherosclerosis.  2. No acute findings in the chest, abdomen or pelvis to explain  pain.   EKG: Sinus rhythm Borderline ST elevation, anterior leads No significant changes from prior tracing, No STEMI Confirmed by Alvester Chou [phone]) on 02/01/2020 3:29:09 PM ------------- Discussed case with on-call cardiology Dr. Jameson Cellar .  Advises that blood pressure control is recommended, no  criteria for admission, recommends follow-up outpatient.  If blood pressure and pain uncontrolled then could admit. - I reevaluated the patient he  is resting comfortably no acute distress reports resolution of pain.  Blood pressure significantly improved following labetalol and pain control.  163/100.  Given patient's risk factors for chest pain I offered patient consult to hospitalist for admission and he refused.  He prefers to follow-up as outpatient.  Ambulatory referral given to cardiology.  Patient started on lisinopril 20 mg as well as a daily baby aspirin.  He is aware that he will need to call cardiology group tomorrow morning to confirm appointment and encouraged to maintain that appointment.  Patient advised that if chest pain returns to come immediately back to the ER.  As to patient's right leg pain and numbness suspect this is secondary to sciatica.  He has no cauda equina type symptoms and no high risk factors for epidural abscess.  Will treat with muscle relaxers and Lidoderm patches and have him follow-up with his PCP.  At this time there does not appear to be any evidence of an acute emergency medical condition and the patient appears stable for discharge with appropriate outpatient follow up. Diagnosis was discussed with patient who verbalizes understanding of care plan and is agreeable to discharge. I have discussed return precautions with patient who verbalizes understanding. Patient encouraged to follow-up with their PCP and cardiology. All questions answered.  Patient seen and evaluated by Dr. Renaye Rakersrifan during this visit who agrees with discharge with lisinopril/baby aspirin and ambulatory referral to cardiology.  Note: Portions of this report may have been transcribed using voice recognition software. Every effort was made to ensure accuracy; however, inadvertent computerized transcription errors may still be present. Final Clinical Impression(s) / ED Diagnoses Final diagnoses:    Chest pain, unspecified type  Hypertension, unspecified type  Right sided sciatica    Rx / DC Orders ED Discharge Orders         Ordered    methocarbamol (ROBAXIN) 500 MG tablet  3 times daily     Discontinue  Reprint     02/01/20 1914    lisinopril (ZESTRIL) 20 MG tablet  Daily     Discontinue  Reprint     02/01/20 1914    lidocaine (LIDODERM) 5 %  Every 24 hours     Discontinue  Reprint     02/01/20 1914    aspirin 81 MG chewable tablet  Daily     Discontinue  Reprint     02/01/20 1914    Ambulatory referral to Cardiology     Discontinue  Reprint     02/01/20 1915           Elizabeth PalauMorelli, Antonis Lor A, PA-C 02/01/20 1943    Terald Sleeperrifan, Matthew J, MD 02/02/20 [phone]

## 2020-02-01 NOTE — ED Triage Notes (Signed)
Pt here for HTN, reports he was seen at Urgent Care and left AMA, [facility] reports their initial BP was 220/120, last BP 171/109; reports he was given 1nitro SL en route, pt also reports right hip and leg pain x 2 weeks that started this am; pt reports some pain to left arm earlier when BP was higher, denies ch pain at this time

## 2020-03-14 NOTE — Progress Notes (Deleted)
Cardiology Office Note:    Date:  03/14/2020   ID:  Dustin Kirby, DOB 02-Sep-1970, MRN [id number]  PCP:  Patient, No Pcp Per  Cardiologist:  No primary care provider on file.  Electrophysiologist:  None   Referring MD: Bill Salinas, PA-C   Chief Complaint/Reason for Referral: Chest pain, left arm pain  History of Present Illness:    Dustin Kirby is a 49 y.o. male with a history of admission in 2013 for rhabdomyolysis with severe hypokalemia and dehydration. Presented to ED about 6 weeks ago with chest pain.    Past Medical History:  Diagnosis Date  . No pertinent past medical history     Past Surgical History:  Procedure Laterality Date  . APPENDECTOMY    . HERNIA REPAIR      Current Medications: No outpatient medications have been marked as taking for the 03/15/20 encounter (Appointment) with Parke Poisson, MD.     Allergies:   Patient has no known allergies.   Social History   Tobacco Use  . Smoking status: Current Every Day Smoker    Types: Cigarettes    Last attempt to quit: 09/25/2011    Years since quitting: 8.4  . Smokeless tobacco: Never Used  . Tobacco comment: using electronical cigs now  Substance Use Topics  . Alcohol use: No  . Drug use: No     Family History: The patient's family history includes Hypertension in his mother.  ROS:   Please see the history of present illness.    All other systems reviewed and are negative.  EKGs/Labs/Other Studies Reviewed:    The following studies were reviewed today:  EKG:  ***  I have independently reviewed the images from CT CAP angio 02/01/20. No coronary calcifications.  Recent Labs: 02/01/2020: BUN 9; Creatinine, Ser 0.74; Hemoglobin 17.9; Platelets 254; Potassium 3.7; Sodium 135  Recent Lipid Panel No results found for: CHOL, TRIG, HDL, CHOLHDL, VLDL, LDLCALC, LDLDIRECT  Physical Exam:    VS:  There were no vitals taken for this visit.    Wt Readings from Last 5 Encounters:  02/01/20 180  lb (81.6 kg)  01/29/20 180 lb (81.6 kg)  01/31/12 167 lb 12.3 oz (76.1 kg)    Constitutional: No acute distress Eyes: sclera non-icteric, normal conjunctiva and lids ENMT: normal dentition, moist mucous membranes Cardiovascular: regular rhythm, normal rate, no murmurs. S1 and S2 normal. Radial pulses normal bilaterally. No jugular venous distention.  Respiratory: clear to auscultation bilaterally GI : normal bowel sounds, soft and nontender. No distention.   MSK: extremities warm, well perfused. No edema.  NEURO: grossly nonfocal exam, moves all extremities. PSYCH: alert and oriented x 3, normal mood and affect.   ASSESSMENT:    No diagnosis found. PLAN:    No diagnosis found.  Total time of encounter: *** minutes total time of encounter, including *** minutes spent in face-to-face patient care on the date of this encounter. This time includes coordination of care and counseling regarding above mentioned problem list. Remainder of non-face-to-face time involved reviewing chart documents/testing relevant to the patient encounter and documentation in the medical record. I have independently reviewed documentation from referring provider.   Weston Brass, MD [place]  [facility]    Medication Adjustments/Labs and Tests Ordered: Current medicines are reviewed at length with the patient today.  Concerns regarding medicines are outlined above.  No orders of the defined types were placed in this encounter.  No orders of the defined types were  placed in this encounter.   There are no Patient Instructions on file for this visit.

## 2020-03-15 ENCOUNTER — Ambulatory Visit: Payer: Self-pay | Admitting: Internal Medicine

## 2021-05-20 IMAGING — CT CT ANGIO CHEST-ABD-PELV FOR DISSECTION W/ AND WO/W CM
2 of 7 series · 14 of 46 positions shown, 16 images · non-contrast
Comparison: None.

CLINICAL DATA: Abdominal pain, aortic dissection suspected

EXAM:
CT ANGIOGRAPHY CHEST, ABDOMEN AND PELVIS
TECHNIQUE: Non-contrast CT of the chest was initially obtained.

[Series 11: axial arterial · axial · arterial · 0.88mm/px · z∈[+845,+1403]mm · 11 of 216 slices shown, 13 images]
[im 15/216  soft-tissue]
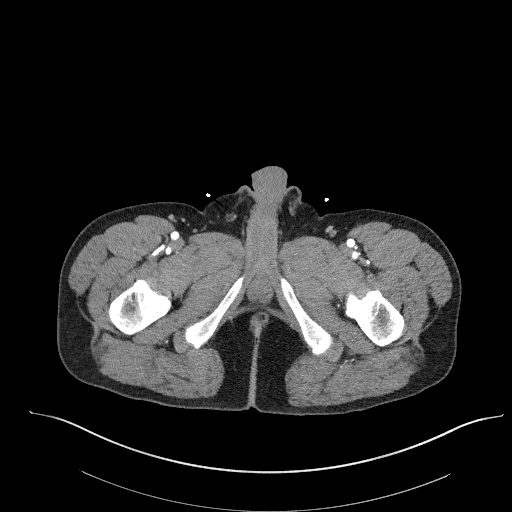
[im 15/216  bone]
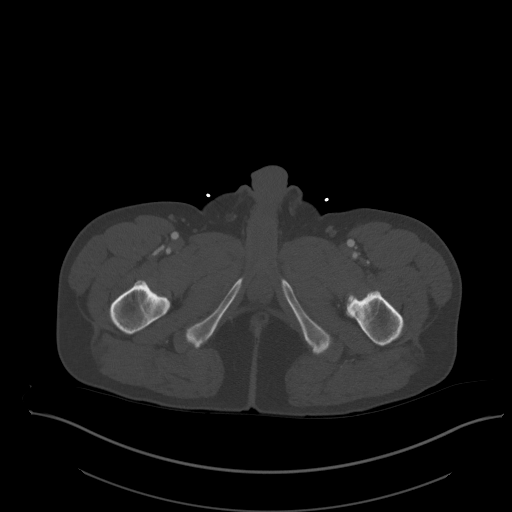
[im 29/216  soft-tissue]
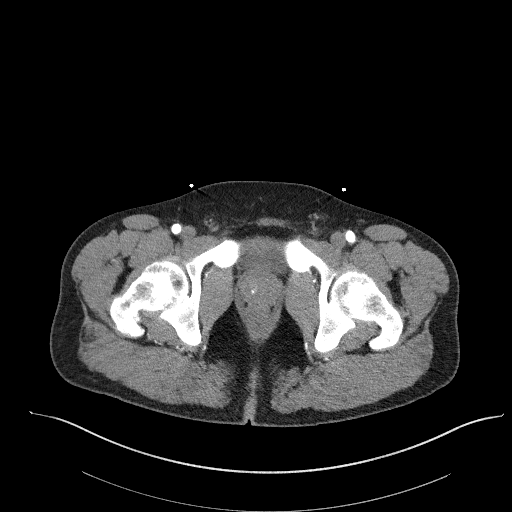
[im 58/216  soft-tissue]
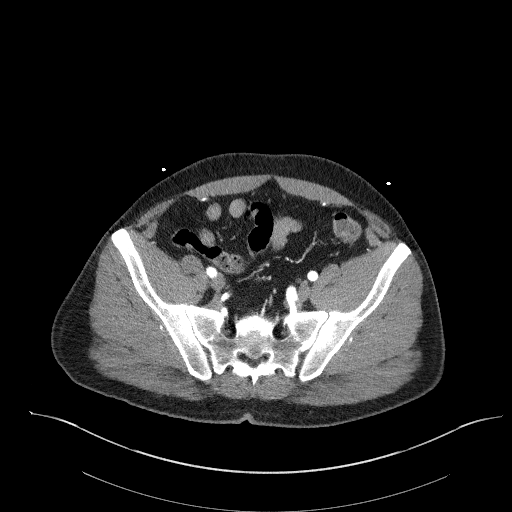
[im 72/216  soft-tissue]
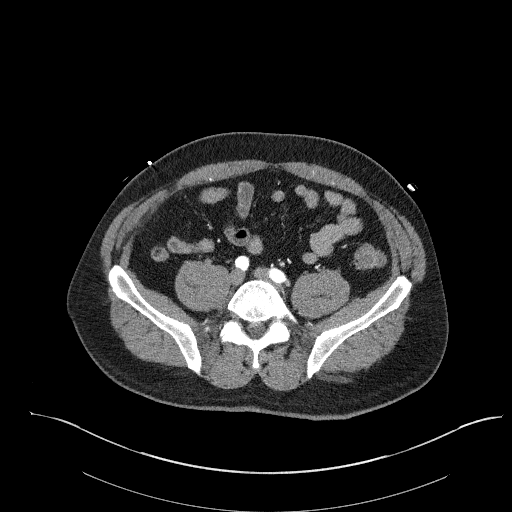
[im 87/216  soft-tissue]
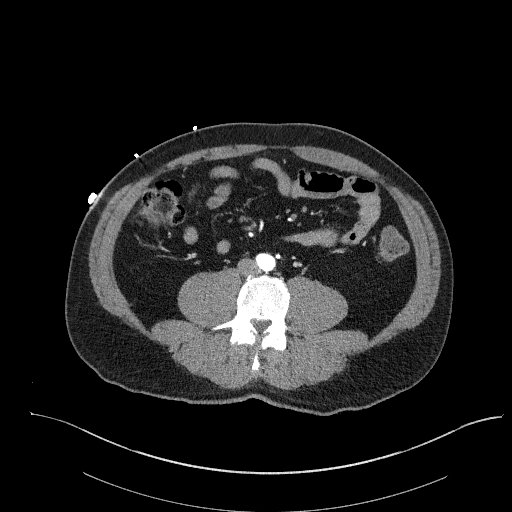
[im 115/216  soft-tissue]
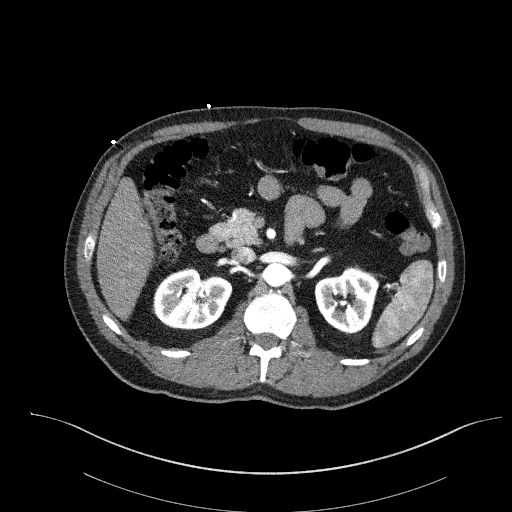
[im 130/216  soft-tissue]
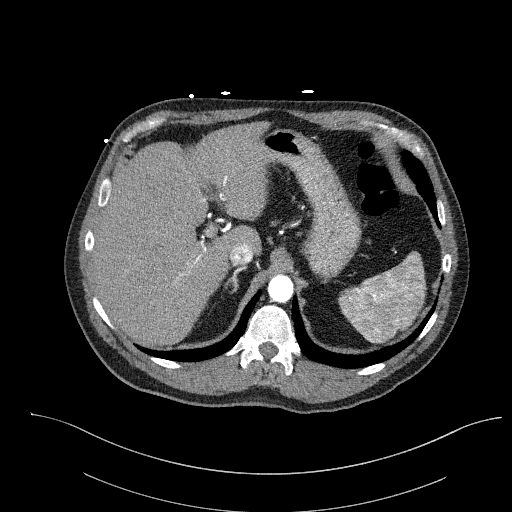
[im 144/216  soft-tissue]
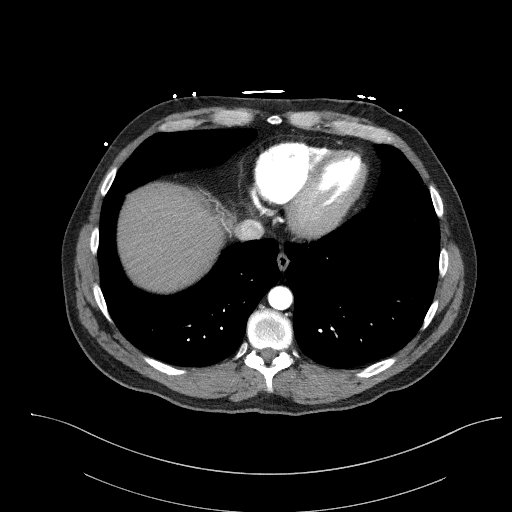
[im 158/216  soft-tissue]
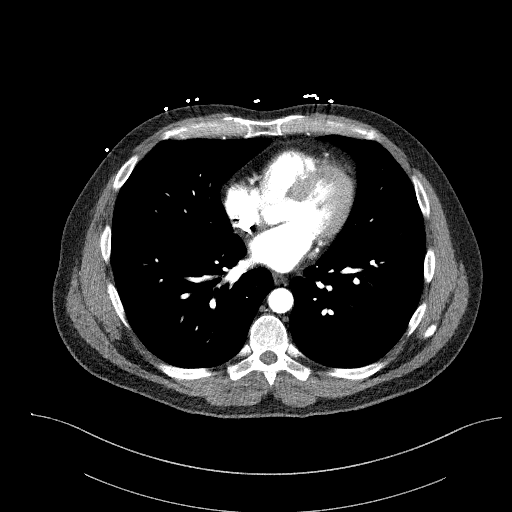
[im 158/216  bone]
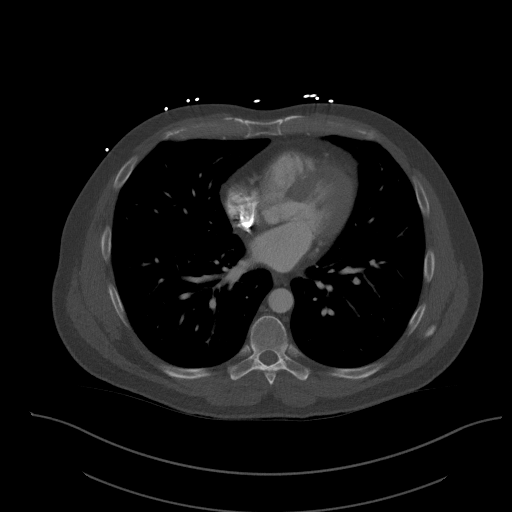
[im 187/216  soft-tissue]
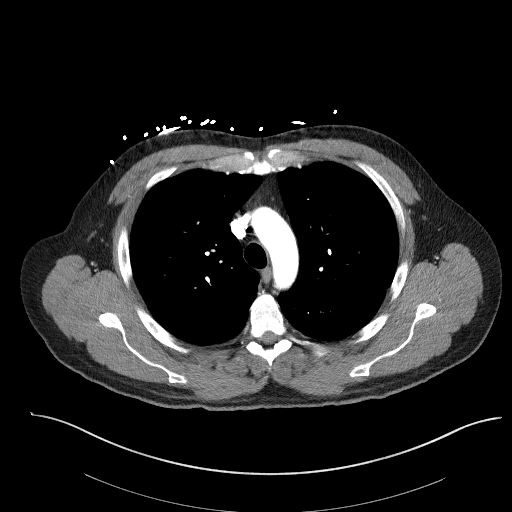
[im 201/216  soft-tissue]
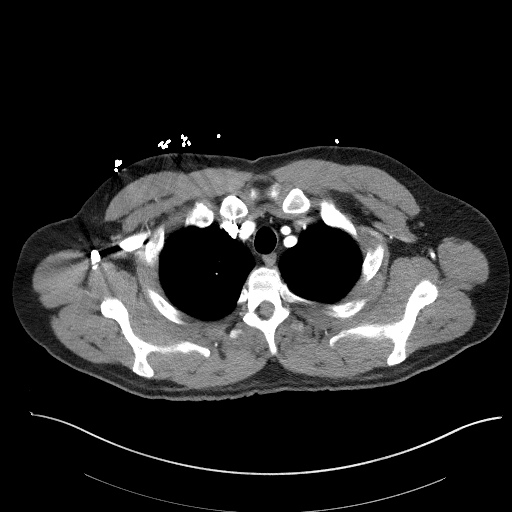

[Series 15: cor soft · coronal · 0.79mm/px · 3 of 155 slices shown]
[im 39/155  soft-tissue]
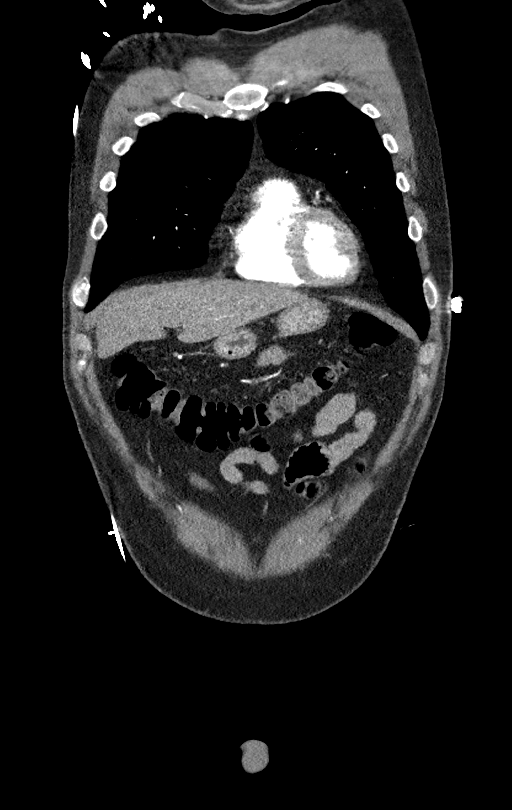
[im 78/155  soft-tissue]
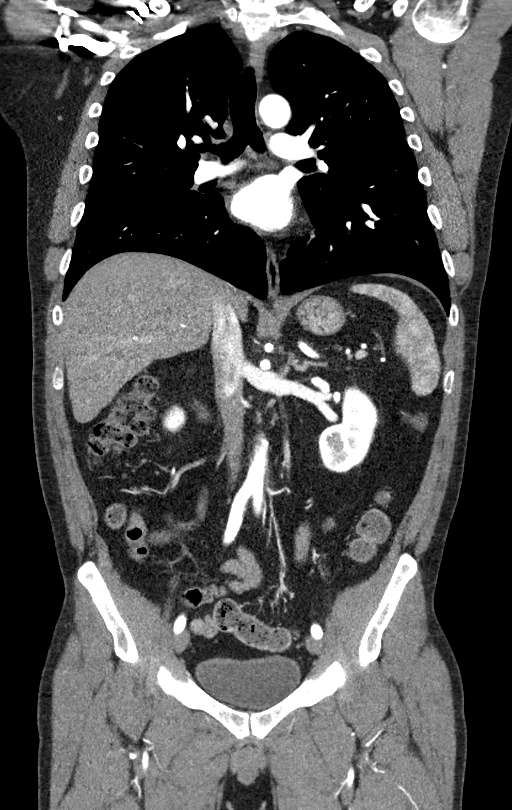
[im 116/155  soft-tissue]
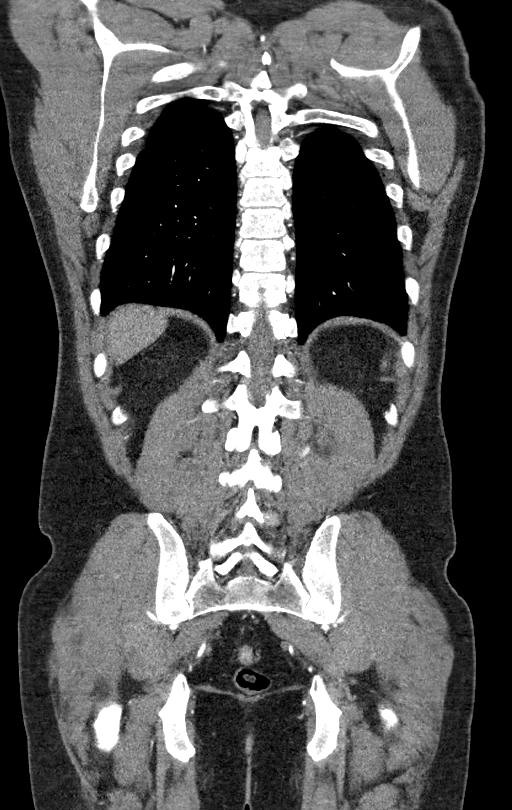

[14 of 46 positions shown; findings below may reference images not displayed]

Multidetector CT imaging through the chest, abdomen and pelvis was
performed using the standard protocol during bolus administration of
intravenous contrast. Multiplanar reconstructed images and MIPs were
obtained and reviewed to evaluate the vascular anatomy.

CONTRAST:  100mL OMNIPAQUE IOHEXOL 350 MG/ML SOLN
FINDINGS: CTA CHEST FINDINGS

Cardiovascular: Preferential opacification of the thoracic aorta.
Normal contour and caliber of the thoracic aorta. No evidence of
aneurysm, dissection, or other acute aortic pathology. No
significant atherosclerosis. Normal heart size. No pericardial
effusion.

Mediastinum/Nodes: No enlarged mediastinal, hilar, or axillary lymph
nodes. Thyroid gland, trachea, and esophagus demonstrate no
significant findings.

Lungs/Pleura: Lungs are clear. No pleural effusion or pneumothorax.

Musculoskeletal: No chest wall abnormality. No acute or significant
osseous findings.

Review of the MIP images confirms the above findings.

CTA ABDOMEN AND PELVIS FINDINGS

VASCULAR

Normal contour and caliber of the abdominal aorta. No evidence of
aneurysm, dissection, or other acute aortic pathology. No
significant atherosclerosis. Incidental note of duplicated right
renal arteries. Solitary left renal artery.

Review of the MIP images confirms the above findings.

NON-VASCULAR

Hepatobiliary: No solid liver abnormality is seen. No gallstones,
gallbladder wall thickening, or biliary dilatation.

Pancreas: Unremarkable. No pancreatic ductal dilatation or
surrounding inflammatory changes.

Spleen: Normal in size without significant abnormality.

Adrenals/Urinary Tract: Adrenal glands are unremarkable. Kidneys are
normal, without renal calculi, solid lesion, or hydronephrosis.
Bladder is unremarkable.

Stomach/Bowel: Stomach is within normal limits. Appendix appears
normal. No evidence of bowel wall thickening, distention, or
inflammatory changes.

Lymphatic: No enlarged abdominal or pelvic lymph nodes.

Reproductive: No mass or other significant abnormality.

Other: No abdominal wall hernia or abnormality. No abdominopelvic
ascites.

Musculoskeletal: No acute or significant osseous findings.

Review of the MIP images confirms the above findings.
IMPRESSION: 1. Normal contour and caliber of the thoracic and abdominal aorta.
No evidence of aneurysm, dissection, or other acute aortic
pathology. No significant atherosclerosis.
2. No acute findings in the chest, abdomen or pelvis to explain
pain.

## 2023-09-03 ENCOUNTER — Other Ambulatory Visit: Payer: Self-pay

## 2023-09-03 ENCOUNTER — Emergency Department (HOSPITAL_BASED_OUTPATIENT_CLINIC_OR_DEPARTMENT_OTHER)

## 2023-09-03 ENCOUNTER — Encounter (HOSPITAL_BASED_OUTPATIENT_CLINIC_OR_DEPARTMENT_OTHER): Payer: Self-pay | Admitting: Emergency Medicine

## 2023-09-03 ENCOUNTER — Emergency Department (HOSPITAL_BASED_OUTPATIENT_CLINIC_OR_DEPARTMENT_OTHER)
Admission: EM | Admit: 2023-09-03 | Discharge: 2023-09-03 | Discharge status: Against Medical Advice | Attending: Emergency Medicine | Admitting: Emergency Medicine

## 2023-09-03 DIAGNOSIS — I10 Essential (primary) hypertension: Secondary | ICD-10-CM | POA: Diagnosis not present

## 2023-09-03 DIAGNOSIS — R1031 Right lower quadrant pain: Secondary | ICD-10-CM | POA: Diagnosis not present

## 2023-09-03 DIAGNOSIS — Z7982 Long term (current) use of aspirin: Secondary | ICD-10-CM | POA: Diagnosis not present

## 2023-09-03 DIAGNOSIS — Z79899 Other long term (current) drug therapy: Secondary | ICD-10-CM | POA: Diagnosis not present

## 2023-09-03 DIAGNOSIS — R109 Unspecified abdominal pain: Secondary | ICD-10-CM | POA: Diagnosis present

## 2023-09-03 HISTORY — DX: Essential (primary) hypertension: I10

## 2023-09-03 LAB — COMPREHENSIVE METABOLIC PANEL
ALT: 23 U/L (ref 0–44)
AST: 25 U/L (ref 15–41)
Albumin: 4.3 g/dL (ref 3.5–5.0)
Alkaline Phosphatase: 78 U/L (ref 38–126)
Anion gap: 7 (ref 5–15)
BUN: 11 mg/dL (ref 6–20)
CO2: 26 mmol/L (ref 22–32)
Calcium: 9.6 mg/dL (ref 8.9–10.3)
Chloride: 102 mmol/L (ref 98–111)
Creatinine, Ser: 0.99 mg/dL (ref 0.61–1.24)
GFR, Estimated: >60 mL/min (ref >60)
Glucose, Bld: 126 mg/dL — ABNORMAL HIGH (ref 70–99)
Potassium: 3.9 mmol/L (ref 3.5–5.1)
Sodium: 135 mmol/L (ref 135–145)
Total Bilirubin: 0.5 mg/dL (ref 0.0–1.2)
Total Protein: 7.3 g/dL (ref 6.5–8.1)

## 2023-09-03 LAB — URINALYSIS, ROUTINE W REFLEX MICROSCOPIC
Bilirubin Urine: NEGATIVE
Glucose, UA: NEGATIVE mg/dL
Ketones, ur: NEGATIVE mg/dL
Leukocytes,Ua: NEGATIVE
Nitrite: NEGATIVE
Protein, ur: NEGATIVE mg/dL
Specific Gravity, Urine: 1.02 (ref 1.005–1.030)
pH: 6 (ref 5.0–8.0)

## 2023-09-03 LAB — LIPASE, BLOOD: Lipase: 41 U/L (ref 11–51)

## 2023-09-03 LAB — CBC WITH DIFFERENTIAL/PLATELET
Abs Immature Granulocytes: 0.05 10*3/uL (ref 0.00–0.07)
Basophils Absolute: 0.1 10*3/uL (ref 0.0–0.1)
Basophils Relative: 1 %
Eosinophils Absolute: 0.4 10*3/uL (ref 0.0–0.5)
Eosinophils Relative: 4 %
HCT: 45.9 % (ref 39.0–52.0)
Hemoglobin: 16.9 g/dL (ref 13.0–17.0)
Immature Granulocytes: 1 %
Lymphocytes Relative: 22 %
Lymphs Abs: 2.2 10*3/uL (ref 0.7–4.0)
MCH: 31.7 pg (ref 26.0–34.0)
MCHC: 36.8 g/dL — ABNORMAL HIGH (ref 30.0–36.0)
MCV: 86.1 fL (ref 80.0–100.0)
Monocytes Absolute: 0.8 10*3/uL (ref 0.1–1.0)
Monocytes Relative: 8 %
Neutro Abs: 6.5 10*3/uL (ref 1.7–7.7)
Neutrophils Relative %: 64 %
Platelets: 215 10*3/uL (ref 150–400)
RBC: 5.33 MIL/uL (ref 4.22–5.81)
RDW: 12.3 % (ref 11.5–15.5)
WBC: 9.9 10*3/uL (ref 4.0–10.5)
nRBC: 0 % (ref 0.0–0.2)

## 2023-09-03 LAB — URINALYSIS, MICROSCOPIC (REFLEX): WBC, UA: NONE SEEN WBC/hpf (ref 0–5)

## 2023-09-03 MED ORDER — IOHEXOL 300 MG/ML  SOLN
100.0000 mL | Freq: Once | INTRAMUSCULAR | Status: AC | PRN
  Administered 2023-09-03: 100 mL via INTRAVENOUS

## 2023-09-03 NOTE — ED Notes (Signed)
 Pt transported to imaging.

## 2023-09-03 NOTE — ED Provider Notes (Signed)
 Patient is a handoff from [place], [place].  Plan at time of handoff is to the results of CT abdomen/pelvis.  Patient here with concerns of right lower quad abdominal pain.  Workup otherwise reassuring at this point.  Patient tolerating pain without significant discomfort. Physical Exam  BP (!) 148/86   Pulse 77   Temp 98.1 F (36.7 C) (Oral)   Resp 15   SpO2 99%   Physical Exam Vitals and nursing note reviewed.  Constitutional:      General: He is not in acute distress.    Appearance: Normal appearance. He is normal weight. He is not ill-appearing.  HENT:     Head: Normocephalic and atraumatic.  Pulmonary:     Effort: Pulmonary effort is normal. No respiratory distress.  Abdominal:     General: Abdomen is flat.     Tenderness: There is no right CVA tenderness or left CVA tenderness. Negative signs include Murphy's sign and McBurney's sign.  Musculoskeletal:        General: Normal range of motion.     Cervical back: Neck supple.  Skin:    General: Skin is warm and dry.  Neurological:     Mental Status: He is alert and oriented to person, place, and time.  Psychiatric:        Mood and Affect: Mood normal.        Behavior: Behavior normal.     Procedures  Procedures  ED Course / MDM    Medical Decision Making Amount and/or Complexity of Data Reviewed Labs: ordered. Radiology: ordered.  Risk Prescription drug management.   Patient is a handoff from [place], [place].  Plan at time of handoff is to the results of CT abdomen/pelvis.  Patient here with concerns of right lower quad abdominal pain.  Workup otherwise reassuring at this point.  Patient tolerating pain without significant discomfort.  CT imaging still pending at this time.  Patient is requesting to leave AMA even with results not available.  Advised patient that we do not recommend leaving AGAINST MEDICAL ADVICE due to concerns that he may have a acutely life-threatening condition that needs to be ruled out with CT  imaging.  Patient is a requesting to leave without results available.  AMA paperwork provided.       Smitty Knudsen, PA-C 09/03/23 2011    Tegeler, Canary Brim, MD 09/03/23 [phone]

## 2023-09-03 NOTE — Discharge Instructions (Signed)
 You are seen in the emergency department today for concerns of right lower quadrant abdominal pain.  Your labs were thankfully all largely reassuring.  Your CT scan is still currently pending.  You are requesting to leave at this time.  If any critical results are to come available in the next few hours, you will receive a call back to return to the emergency department.  Otherwise, you may have access to your results on MyChart.  If you are concerned that symptoms are worsening, return the emergency department.

## 2023-09-03 NOTE — ED Provider Notes (Cosign Needed)
 [facility] Provider Note   CSN: [id number] Arrival date & time: 09/03/23  1354     History  Chief Complaint  Patient presents with   Abdominal Pain    Dustin Kirby is a 53 y.o. male.  With history of appendectomy, hypertension presenting to the ED for evaluation of right-sided abdominal pain.  Symptoms began approximately 1 week ago.  Pain is intermittent and described as a stabbing sensation.  Pain radiates into the right groin.  Lasts approximately 1 hour at a time.  He states that he developed pain after leaning forward at work earlier today.  The pain resolved while in the waiting room.  No history of kidney stones.  No hematuria but reports urinary frequency and urgency.  No fevers or chills.  No nausea or vomiting.  States he feels like there is an area of bloating.  No bowel complaints.   Abdominal Pain      Home Medications Prior to Admission medications   Medication Sig Start Date End Date Taking? Authorizing Provider  aspirin 81 MG chewable tablet Chew 1 tablet (81 mg total) by mouth daily. 02/01/20   Harlene Salts A, PA-C  lidocaine (LIDODERM) 5 % Place 1 patch onto the skin daily. Remove & Discard patch within 12 hours or as directed by MD 02/01/20   Harlene Salts A, PA-C  lisinopril (ZESTRIL) 20 MG tablet Take 1 tablet (20 mg total) by mouth daily. 02/01/20   Harlene Salts A, PA-C  methocarbamol (ROBAXIN) 500 MG tablet Take 1 tablet (500 mg total) by mouth 3 (three) times daily. 02/01/20   Bill Salinas, PA-C      Allergies    Patient has no known allergies.    Review of Systems   Review of Systems  Gastrointestinal:  Positive for abdominal pain.  All other systems reviewed and are negative.   Physical Exam Updated Vital Signs BP (!) 148/86   Pulse 77   Temp 98.1 F (36.7 C) (Oral)   Resp 15   SpO2 99%  Physical Exam Vitals and nursing note reviewed.  Constitutional:      General: He is not in acute  distress.    Appearance: Normal appearance. He is normal weight. He is not ill-appearing.  HENT:     Head: Normocephalic and atraumatic.  Pulmonary:     Effort: Pulmonary effort is normal. No respiratory distress.  Abdominal:     General: Abdomen is flat.     Tenderness: There is no right CVA tenderness or left CVA tenderness. Negative signs include Murphy's sign and McBurney's sign.  Musculoskeletal:        General: Normal range of motion.     Cervical back: Neck supple.  Skin:    General: Skin is warm and dry.  Neurological:     Mental Status: He is alert and oriented to person, place, and time.  Psychiatric:        Mood and Affect: Mood normal.        Behavior: Behavior normal.     ED Results / Procedures / Treatments   Labs (all labs ordered are listed, but only abnormal results are displayed) Labs Reviewed  COMPREHENSIVE METABOLIC PANEL - Abnormal; Notable for the following components:      Result Value   Glucose, Bld 126 (*)    All other components within normal limits  URINALYSIS, ROUTINE W REFLEX MICROSCOPIC - Abnormal; Notable for the following components:   Hgb urine  dipstick TRACE (*)    All other components within normal limits  CBC WITH DIFFERENTIAL/PLATELET - Abnormal; Notable for the following components:   MCHC 36.8 (*)    All other components within normal limits  URINALYSIS, MICROSCOPIC (REFLEX) - Abnormal; Notable for the following components:   Bacteria, UA RARE (*)    All other components within normal limits  LIPASE, BLOOD    EKG None  Radiology No results found.  Procedures Procedures    Medications Ordered in ED Medications  iohexol (OMNIPAQUE) 300 MG/ML solution 100 mL (100 mLs Intravenous Contrast Given 09/03/23 1822)    ED Course/ Medical Decision Making/ A&P                                 Medical Decision Making Amount and/or Complexity of Data Reviewed Labs: ordered. Radiology: ordered.  Risk Prescription drug  management.  This patient presents to the ED for concern of right-sided abdominal pain, this involves an extensive number of treatment options, and is a complaint that carries with it a high risk of complications and morbidity. The differential diagnosis for generalized abdominal pain includes, but is not limited to AAA, gastroenteritis, appendicitis, Bowel obstruction, Bowel perforation. Gastroparesis, DKA, Hernia, Inflammatory bowel disease, mesenteric ischemia, pancreatitis, peritonitis SBP, volvulus.   My initial workup includes labs, imaging.  Patient declines pain medication  Additional history obtained from: Nursing notes from this visit.  I ordered, reviewed and interpreted labs which include: CBC, CMP, lipase, urinalysis.  No leukocytosis or anemia.  No electrolyte derangement or kidney dysfunction.  Normal LFTs.  Urine without evidence of infection, does have trace hemoglobin and rare bacteria  I ordered imaging studies including CT abdomen pelvis I independently visualized and interpreted imaging which showed pending at shift change  Afebrile, hemodynamically stable.  53 year old male presenting to the ED for evaluation of right-sided abdominal pain.  Intermittent over the past week.  Symptoms resolved prior to my assessment.  Has a history of appendectomy.  Lab workup overall very reassuring.  Urine without evidence of infection.  He has a reassuring physical exam as well.  Question of this may be renal colic.  Care handed off to oncoming provider pending CT abdomen pelvis.  Disposition pending CT results.  Please see their note for final disposition and decision making.  Note: Portions of this report may have been transcribed using voice recognition software. Every effort was made to ensure accuracy; however, inadvertent computerized transcription errors may still be present.        Final Clinical Impression(s) / ED Diagnoses Final diagnoses:  Right lower quadrant abdominal  pain    Rx / DC Orders ED Discharge Orders     None         Michelle Piper, [place] 09/03/23 1829

## 2023-09-03 NOTE — ED Triage Notes (Signed)
 Right sided intermittent "stabbing" abdominal pain ongoing for 1 week. Endorses nausea and urinary frequency. No vomiting. No fevers.

## 2023-09-10 ENCOUNTER — Encounter: Payer: Self-pay | Admitting: Urgent Care

## 2023-09-10 ENCOUNTER — Ambulatory Visit: Admitting: Urgent Care

## 2023-09-10 VITALS — BP 152/73 | HR 80 | Ht 68.0 in | Wt 183.0 lb

## 2023-09-10 DIAGNOSIS — N329 Bladder disorder, unspecified: Secondary | ICD-10-CM | POA: Diagnosis not present

## 2023-09-10 DIAGNOSIS — Z789 Other specified health status: Secondary | ICD-10-CM

## 2023-09-10 DIAGNOSIS — I10 Essential (primary) hypertension: Secondary | ICD-10-CM | POA: Insufficient documentation

## 2023-09-10 DIAGNOSIS — F17210 Nicotine dependence, cigarettes, uncomplicated: Secondary | ICD-10-CM | POA: Insufficient documentation

## 2023-09-10 DIAGNOSIS — R1031 Right lower quadrant pain: Secondary | ICD-10-CM | POA: Diagnosis not present

## 2023-09-10 DIAGNOSIS — R350 Frequency of micturition: Secondary | ICD-10-CM | POA: Diagnosis not present

## 2023-09-10 DIAGNOSIS — K8020 Calculus of gallbladder without cholecystitis without obstruction: Secondary | ICD-10-CM

## 2023-09-10 LAB — POCT URINALYSIS DIPSTICK
Bilirubin, UA: NEGATIVE
Blood, UA: NEGATIVE
Glucose, UA: NEGATIVE
Ketones, UA: NEGATIVE
Leukocytes, UA: NEGATIVE
Nitrite, UA: NEGATIVE
Protein, UA: NEGATIVE
Spec Grav, UA: 1.02 (ref 1.010–1.025)
Urobilinogen, UA: 0.2 U/dL
pH, UA: 6 (ref 5.0–8.0)

## 2023-09-10 LAB — COMPREHENSIVE METABOLIC PANEL
ALT: 23 U/L (ref 0–53)
AST: 23 U/L (ref 0–37)
Albumin: 4.6 g/dL (ref 3.5–5.2)
Alkaline Phosphatase: 83 U/L (ref 39–117)
BUN: 9 mg/dL (ref 6–23)
CO2: 30 meq/L (ref 19–32)
Calcium: 9.4 mg/dL (ref 8.4–10.5)
Chloride: 101 meq/L (ref 96–112)
Creatinine, Ser: 0.92 mg/dL (ref 0.40–1.50)
GFR: 96.18 mL/min (ref >60.00)
Glucose, Bld: 95 mg/dL (ref 70–99)
Potassium: 4 meq/L (ref 3.5–5.1)
Sodium: 138 meq/L (ref 135–145)
Total Bilirubin: 0.5 mg/dL (ref 0.2–1.2)
Total Protein: 6.5 g/dL (ref 6.0–8.3)

## 2023-09-10 LAB — CBC WITH DIFFERENTIAL/PLATELET
Basophils Absolute: 0.1 10*3/uL (ref 0.0–0.1)
Basophils Relative: 1 % (ref 0.0–3.0)
Eosinophils Absolute: 0.4 10*3/uL (ref 0.0–0.7)
Eosinophils Relative: 4.7 % (ref 0.0–5.0)
HCT: 47.9 % (ref 39.0–52.0)
Hemoglobin: 16.9 g/dL (ref 13.0–17.0)
Lymphocytes Relative: 19.7 % (ref 12.0–46.0)
Lymphs Abs: 1.7 10*3/uL (ref 0.7–4.0)
MCHC: 35.3 g/dL (ref 30.0–36.0)
MCV: 90.6 fl (ref 78.0–100.0)
Monocytes Absolute: 0.8 10*3/uL (ref 0.1–1.0)
Monocytes Relative: 8.9 % (ref 3.0–12.0)
Neutro Abs: 5.6 10*3/uL (ref 1.4–7.7)
Neutrophils Relative %: 65.7 % (ref 43.0–77.0)
Platelets: 217 10*3/uL (ref 150.0–400.0)
RBC: 5.29 Mil/uL (ref 4.22–5.81)
RDW: 12.9 % (ref 11.5–15.5)
WBC: 8.5 10*3/uL (ref 4.0–10.5)

## 2023-09-10 LAB — PSA: PSA: 1.03 ng/mL (ref 0.10–4.00)

## 2023-09-10 LAB — LIPID PANEL
Cholesterol: 155 mg/dL (ref 0–200)
HDL: 30.7 mg/dL — ABNORMAL LOW (ref >39.00)
LDL Cholesterol: 87 mg/dL (ref 0–99)
NonHDL: 124.79
Total CHOL/HDL Ratio: 5
Triglycerides: 191 mg/dL — ABNORMAL HIGH (ref 0.0–149.0)
VLDL: 38.2 mg/dL (ref 0.0–40.0)

## 2023-09-10 LAB — HEMOGLOBIN A1C: Hgb A1c MFr Bld: 5.4 % (ref 4.6–6.5)

## 2023-09-10 LAB — TSH: TSH: 4.22 u[IU]/mL (ref 0.35–5.50)

## 2023-09-10 MED ORDER — TAMSULOSIN HCL 0.4 MG PO CAPS: 0.4000 mg | ORAL_CAPSULE | Freq: Every day | ORAL | 0 refills | Status: DC

## 2023-09-10 MED ORDER — AMOXICILLIN-POT CLAVULANATE 875-125 MG PO TABS: 1.0000 | ORAL_TABLET | Freq: Two times a day (BID) | ORAL | 0 refills | Status: DC

## 2023-09-10 NOTE — Progress Notes (Signed)
 New Patient Office Visit  Subjective:  Patient ID: Dustin Kirby, male    DOB: 1970/12/24  Age: 53 y.o. MRN: [id number]  CC:  Chief Complaint  Patient presents with   Establish Care   Abdominal Pain    Right side x 2 weeks    HPI [name] presents to establish care.  Pleasant 53 year old male with a history of heart attack who presents with right-sided abdominal pain.  He has been experiencing right-sided abdominal pain for the past week, which began after he bent over quickly at work. The pain is described as searing, extending from the ribs down into the pelvis, and sometimes radiating to the lower back. It is intermittent, worsens with certain movements, and is alleviated by sitting and leaning to the left or lying down. He also reports a stabbing sensation at times, and touching the area can provoke pain. No nausea, vomiting, or fever. His appetite was slightly off last week but has since normalized. No blood in the urine. Pt has hx of appendectomy years ago.  During an emergency room visit on 09/03/23, he was informed that he might have passed a kidney stone, although he did not see any stone. He experienced relief after urinating while in the waiting room, but the pain returned shortly after leaving the hospital. He has never had a kidney stone before.  He is currently taking lisinopril 20mg  and metoprolol succinate 50 mg once daily at night. He has a history of a heart attack in 2021, for which he underwent stress tests that showed no blockages.  Socially, he smokes about a pack of cigarettes a day and consumes a significant amount of caffeine, primarily from [employer]. 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 He reports frequent urination during the day (20+ times), but only wakes once or twice at night to urinate.        Outpatient Encounter Medications as of 09/10/2023  Medication Sig   amoxicillin-clavulanate (AUGMENTIN) 875-125 MG tablet Take 1 tablet by mouth 2 (two)  times daily with a meal for 7 days.   cetirizine (ZYRTEC) 10 MG tablet Take 10 mg by mouth daily.   lisinopril (ZESTRIL) 20 MG tablet Take 1 tablet (20 mg total) by mouth daily.   metoprolol succinate (TOPROL-XL) 50 MG 24 hr tablet Take 50 mg by mouth at bedtime. Take with or immediately following a meal.   tamsulosin (FLOMAX) 0.4 MG CAPS capsule Take 1 capsule (0.4 mg total) by mouth daily.   [DISCONTINUED] aspirin 81 MG chewable tablet Chew 1 tablet (81 mg total) by mouth daily. (Patient not taking: Reported on 09/10/2023)   [DISCONTINUED] lidocaine (LIDODERM) 5 % Place 1 patch onto the skin daily. Remove & Discard patch within 12 hours or as directed by MD (Patient not taking: Reported on 09/10/2023)   [DISCONTINUED] methocarbamol (ROBAXIN) 500 MG tablet Take 1 tablet (500 mg total) by mouth 3 (three) times daily.   No facility-administered encounter medications on file as of 09/10/2023.    Past Medical History:  Diagnosis Date   Hypertension    No pertinent past medical history     Past Surgical History:  Procedure Laterality Date   APPENDECTOMY     HERNIA REPAIR      Family History  Problem Relation Age of Onset   Hypertension Mother    Kidney disease Mother    Stroke Mother     Social History   Socioeconomic History   Marital status: Single  Spouse name: Not on file   Number of children: Not on file   Years of education: Not on file   Highest education level: GED or equivalent  Occupational History   Not on file  Tobacco Use   Smoking status: Every Day    Current packs/day: 1.00    Average packs/day: 1 pack/day for 35.0 years (35.0 ttl pk-yrs)    Types: Cigarettes   Smokeless tobacco: Never   Tobacco comments:    35 pack year hx  Vaping Use   Vaping status: Never Used  Substance and Sexual Activity   Alcohol use: No   Drug use: Yes    Frequency: 14.0 times per week    Types: Marijuana   Sexual activity: Not Currently    Birth control/protection:  Abstinence  Other Topics Concern   Not on file  Social History Narrative   Not on file   Social Drivers of Health   Financial Resource Strain: Medium Risk (09/04/2023)   Overall Financial Resource Strain (CARDIA)    Difficulty of Paying Living Expenses: Somewhat hard  Food Insecurity: Food Insecurity Present (09/04/2023)   Hunger Vital Sign    Worried About Running Out of Food in the Last Year: Sometimes true    Ran Out of Food in the Last Year: Sometimes true  Transportation Needs: No Transportation Needs (09/04/2023)   PRAPARE - Administrator, Civil Service (Medical): No    Lack of Transportation (Non-Medical): No  Physical Activity: Unknown (09/04/2023)   Exercise Vital Sign    Days of Exercise per Week: 0 days    Minutes of Exercise per Session: Not on file  Stress: No Stress Concern Present (09/04/2023)   [employer] of Occupational Health - Occupational Stress Questionnaire    Feeling of Stress : Only a little  Social Connections: Socially Isolated (09/04/2023)   Social Connection and Isolation Panel [NHANES]    Frequency of Communication with Friends and Family: More than three times a week    Frequency of Social Gatherings with Friends and Family: Once a week    Attends Religious Services: Never    Database administrator or Organizations: No    Attends Engineer, structural: Not on file    Marital Status: Widowed  Intimate Partner Violence: Not At Risk (04/29/2023)   Received from [facility]   HITS    Over the last 12 months how often did your partner physically hurt you?: Never    Over the last 12 months how often did your partner insult you or talk down to you?: Never    Over the last 12 months how often did your partner threaten you with physical harm?: Never    Over the last 12 months how often did your partner scream or curse at you?: Never    ROS: as noted in HPI  Objective:  BP (!) 152/73   Pulse 80   Ht 5\' 8"  (1.727 m)   Wt 183 lb  (83 kg)   SpO2 97%   BMI 27.83 kg/m   Physical Exam Vitals and nursing note reviewed.  Constitutional:      General: He is not in acute distress.    Appearance: Normal appearance. He is not ill-appearing, toxic-appearing or diaphoretic.  HENT:     Head: Normocephalic and atraumatic.     Right Ear: External ear normal.     Left Ear: External ear normal.     Mouth/Throat:     Mouth: Mucous  membranes are moist.  Eyes:     General: No scleral icterus.       Right eye: No discharge.        Left eye: No discharge.     Extraocular Movements: Extraocular movements intact.     Pupils: Pupils are equal, round, and reactive to light.  Cardiovascular:     Rate and Rhythm: Normal rate and regular rhythm.     Pulses: Normal pulses.     Heart sounds: No murmur heard. Pulmonary:     Effort: Pulmonary effort is normal. No respiratory distress.     Breath sounds: Normal breath sounds. No stridor. No wheezing or rhonchi.  Abdominal:     General: Abdomen is flat. There is no distension.     Palpations: Abdomen is soft. There is no shifting dullness, hepatomegaly or splenomegaly.     Tenderness: There is abdominal tenderness in the right lower quadrant. There is guarding (mild guarding to RLQ). There is no right CVA tenderness, left CVA tenderness or rebound. Positive signs include obturator sign. Negative signs include Murphy's sign, Rovsing's sign, McBurney's sign and psoas sign.     Hernia: No hernia (none palpable) is present.    Musculoskeletal:     Cervical back: Normal range of motion.  Lymphadenopathy:     Cervical: No cervical adenopathy.  Neurological:     Mental Status: He is alert.    Lab Results  Component Value Date   COLORU yellow 09/10/2023   CLARITYU clear 09/10/2023   GLUCOSEUR Negative 09/10/2023   BILIRUBINUR negative 09/10/2023   KETONESU negative 09/10/2023   SPECGRAV 1.020 09/10/2023   RBCUR negative 09/10/2023   PHUR 6.0 09/10/2023   PROTEINUR Negative  09/10/2023   UROBILINOGEN 0.2 09/10/2023   LEUKOCYTESUR Negative 09/10/2023    Last CBC Lab Results  Component Value Date   WBC 9.9 09/03/2023   HGB 16.9 09/03/2023   HCT 45.9 09/03/2023   MCV 86.1 09/03/2023   MCH 31.7 09/03/2023   RDW 12.3 09/03/2023   PLT 215 09/03/2023   Last metabolic panel Lab Results  Component Value Date   GLUCOSE 126 (H) 09/03/2023   NA 135 09/03/2023   K 3.9 09/03/2023   CL 102 09/03/2023   CO2 26 09/03/2023   BUN 11 09/03/2023   CREATININE 0.99 09/03/2023   GFRNONAA >60 09/03/2023   CALCIUM 9.6 09/03/2023   PHOS 2.8 02/02/2012   PROT 7.3 09/03/2023   ALBUMIN 4.3 09/03/2023   BILITOT 0.5 09/03/2023   ALKPHOS 78 09/03/2023   AST 25 09/03/2023   ALT 23 09/03/2023   ANIONGAP 7 09/03/2023      Assessment & Plan:  Bladder disorder -     POCT urinalysis dipstick -     Urine Microscopic -     Urine Culture -     CBC with Differential/Platelet -     PSA -     Amoxicillin-Pot Clavulanate; Take 1 tablet by mouth 2 (two) times daily with a meal for 7 days.  Dispense: 14 tablet; Refill: 0 -     Tamsulosin HCl; Take 1 capsule (0.4 mg total) by mouth daily.  Dispense: 10 capsule; Refill: 0  Urinary frequency -     POCT urinalysis dipstick -     Urine Microscopic -     Urine Culture -     CBC with Differential/Platelet -     Hemoglobin A1c -     PSA -     Amoxicillin-Pot Clavulanate; Take 1 tablet  by mouth 2 (two) times daily with a meal for 7 days.  Dispense: 14 tablet; Refill: 0 -     Tamsulosin HCl; Take 1 capsule (0.4 mg total) by mouth daily.  Dispense: 10 capsule; Refill: 0  Right lower quadrant abdominal pain -     CBC with Differential/Platelet -     TSH -     Comprehensive metabolic panel -     Amoxicillin-Pot Clavulanate; Take 1 tablet by mouth 2 (two) times daily with a meal for 7 days.  Dispense: 14 tablet; Refill: 0 -     Tamsulosin HCl; Take 1 capsule (0.4 mg total) by mouth daily.  Dispense: 10 capsule; Refill: 0  Cigarette  smoker -     Lipid panel -     PSA  Caffeine use  Calculus of gallbladder without cholecystitis without obstruction  Essential hypertension  Assessment and Plan    Abdominal Pain Intermittent pain from right rib to pelvis. CT showed cholelithiasis, slight bladder wall thickening. Urinalysis indicated rare bacteria, hematuria. Possible kidney stone, hernia, or bladder disorder. Hernia not visible on CT. Additionally, pt does drink excessive amounts of caffeine and has a 35 pack year history - Obtain urine sample for urinalysis, urine micro, and culture. - in office UA dip appears negative - Evaluate for hernia if symptoms persist. - Consider further evaluation if hematuria persists.  Hypertension Hypertension managed with lisinopril and metoprolol succinate.  - Continue lisinopril and metoprolol, monitor BP at home  Bladder Irritation High caffeine intake and smoking may contribute to bladder irritation. - Advise smoking cessation. - Recommend reducing caffeine intake.  - will do trial of augmentin and flomax to treat for possible bladder infection vs nephrolithiasis - return for recheck in one week.       Return in about 1 week (around 09/17/2023).   Maretta Bees, PA

## 2023-09-10 NOTE — Patient Instructions (Addendum)
 Please take one tablet of Augmentin, twice daily with food.  Please also take one tablet of flomax (tamsulosin) daily. This will help pass a stone (if present).  Please try to cut back on caffeine intake, and replace it with water! If any acute changes to your symptoms, please notify me via Mychart and we will obtain further imaging.  Please follow up in one week for recheck.

## 2023-09-11 ENCOUNTER — Encounter: Payer: Self-pay | Admitting: Urgent Care

## 2023-09-11 LAB — URINE CULTURE
MICRO NUMBER:: 16209699
Result:: NO GROWTH
SPECIMEN QUALITY:: ADEQUATE

## 2023-09-11 LAB — URINALYSIS, MICROSCOPIC ONLY
Bacteria, UA: NONE SEEN /HPF
Hyaline Cast: NONE SEEN /LPF
RBC / HPF: NONE SEEN /HPF (ref 0–2)
Squamous Epithelial / HPF: NONE SEEN /HPF (ref <=5)
WBC, UA: NONE SEEN /HPF (ref 0–5)

## 2023-09-17 ENCOUNTER — Encounter: Payer: Self-pay | Admitting: Urgent Care

## 2023-09-17 ENCOUNTER — Ambulatory Visit: Admitting: Urgent Care

## 2023-09-17 VITALS — BP 119/72 | HR 74 | Wt 186.0 lb

## 2023-09-17 DIAGNOSIS — F17210 Nicotine dependence, cigarettes, uncomplicated: Secondary | ICD-10-CM | POA: Diagnosis not present

## 2023-09-17 DIAGNOSIS — I10 Essential (primary) hypertension: Secondary | ICD-10-CM | POA: Diagnosis not present

## 2023-09-17 DIAGNOSIS — R1031 Right lower quadrant pain: Secondary | ICD-10-CM

## 2023-09-17 DIAGNOSIS — Z789 Other specified health status: Secondary | ICD-10-CM

## 2023-09-17 DIAGNOSIS — N329 Bladder disorder, unspecified: Secondary | ICD-10-CM

## 2023-09-17 MED ORDER — TIZANIDINE HCL 4 MG PO TABS
4.0000 mg | ORAL_TABLET | Freq: Three times a day (TID) | ORAL | 0 refills | Status: DC | PRN
Start: 2023-09-17 — End: 2023-09-28

## 2023-09-17 MED ORDER — DICLOFENAC SODIUM 75 MG PO TBEC: 75.0000 mg | DELAYED_RELEASE_TABLET | Freq: Two times a day (BID) | ORAL | 0 refills | Status: AC

## 2023-09-17 NOTE — Progress Notes (Signed)
 Established Patient Office Visit  Subjective:  Patient ID: Dustin Kirby, male    DOB: [birth year]  Age: 53 y.o. MRN: [id number]  Chief Complaint  Patient presents with   Follow-up    1 Week follow up from last visit. Pt states he is still having pain.    53yo male presents today for a follow up visit. Was seen one week ago for RLQ pain. Was prescribed augmentin and flomax to see if this was urinary in nature, with no real response to tx. He states that the pain fluctuates. He was able to work all day Friday without any pain, but then reports on Saturday when he woke up it was there. He did a lot of heavy lifting on Friday. Saturday he took it easy, and by Sunday the pain was resolved. He has not been using any OTC medications for relief. No topical heat or ice. Movements, particularly bending at the waist, and lifting objects seems to make the pain worse. Rotation to the sides and walking does not cause any issues. No bulge or hernia. No change in bowel movements. Labs and urine from last visit unremarkable. CT scan from 09/03/23 showed bladder wall thickening, but no other concerning abnormalities. Pt does smoke. Also has hx of excessive caffeine intake.     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   Dehydration 01/29/2012   Past Medical History:  Diagnosis Date   Hypertension    No pertinent past medical history    Past Surgical History:  Procedure Laterality Date   APPENDECTOMY     HERNIA REPAIR     Social History   Tobacco Use   Smoking status: Every Day    Current packs/day: 1.00    Average packs/day: 1 pack/day for 35.0 years (35.0 ttl pk-yrs)    Types: Cigarettes   Smokeless tobacco: Never   Tobacco comments:    35 pack year hx   Vaping Use   Vaping status: Never Used  Substance Use Topics   Alcohol use: No   Drug use: Yes    Frequency: 14.0 times per week    Types: Marijuana      ROS: as noted in HPI  Objective:     BP 119/72   Pulse 74   Wt 186 lb (84.4 kg)   SpO2 99%   BMI 28.28 kg/m  BP Readings from Last 3 Encounters:  09/17/23 119/72  09/10/23 (!) 152/73  09/03/23 (!) 148/86   Wt Readings from Last 3 Encounters:  09/17/23 186 lb (84.4 kg)  09/10/23 183 lb (83 kg)  02/01/20 180 lb (81.6 kg)      Physical Exam Vitals and nursing note reviewed.  Constitutional:      General: He is not in acute distress.    Appearance: Normal appearance. He is not ill-appearing, toxic-appearing or diaphoretic.  HENT:     Head: Normocephalic and atraumatic.     Right Ear: External ear normal.     Left Ear: External ear normal.     Mouth/Throat:     Mouth: Mucous membranes are moist.  Eyes:     General: No scleral icterus.       Right eye: No discharge.        Left eye: No discharge.  Extraocular Movements: Extraocular movements intact.     Pupils: Pupils are equal, round, and reactive to light.  Cardiovascular:     Rate and Rhythm: Normal rate and regular rhythm.     Pulses: Normal pulses.     Heart sounds: No murmur heard. Pulmonary:     Effort: Pulmonary effort is normal. No respiratory distress.     Breath sounds: Normal breath sounds. No stridor. No wheezing or rhonchi.  Abdominal:     General: Abdomen is flat. There is no distension.     Palpations: Abdomen is soft. There is no shifting dullness, hepatomegaly or splenomegaly.     Tenderness: There is abdominal tenderness in the right lower quadrant. There is no right CVA tenderness, left CVA tenderness, guarding or rebound. Negative signs include Murphy's sign, Rovsing's sign, McBurney's sign, psoas sign and obturator sign.     Hernia: No hernia (none palpable) is present.    Musculoskeletal:     Cervical back: Normal range of  motion.  Lymphadenopathy:     Cervical: No cervical adenopathy.  Neurological:     Mental Status: He is alert.      No results found for any visits on 09/17/23.  Last CBC Lab Results  Component Value Date   WBC 8.5 09/10/2023   HGB 16.9 09/10/2023   HCT 47.9 09/10/2023   MCV 90.6 09/10/2023   MCH 31.7 09/03/2023   RDW 12.9 09/10/2023   PLT 217.0 09/10/2023   Last metabolic panel Lab Results  Component Value Date   GLUCOSE 95 09/10/2023   NA 138 09/10/2023   K 4.0 09/10/2023   CL 101 09/10/2023   CO2 30 09/10/2023   BUN 9 09/10/2023   CREATININE 0.92 09/10/2023   GFR 96.18 09/10/2023   CALCIUM 9.4 09/10/2023   PHOS 2.8 02/02/2012   PROT 6.5 09/10/2023   ALBUMIN 4.6 09/10/2023   BILITOT 0.5 09/10/2023   ALKPHOS 83 09/10/2023   AST 23 09/10/2023   ALT 23 09/10/2023   ANIONGAP 7 09/03/2023      The ASCVD Risk score (Arnett DK, et al., 2019) failed to calculate for the following reasons:   Risk score cannot be calculated because patient has a medical history suggesting prior/existing ASCVD  Assessment & Plan:  Right lower quadrant abdominal pain -     Diclofenac Sodium; Take 1 tablet (75 mg total) by mouth 2 (two) times daily with a meal.  Dispense: 28 tablet; Refill: 0 -     tiZANidine HCl; Take 1 tablet (4 mg total) by mouth every 8 (eight) hours as needed for muscle spasms.  Dispense: 30 tablet; Refill: 0  Essential hypertension  Caffeine use  Cigarette smoker  Bladder disorder   Thus far, workup to date has been negative for urinary or colonic cause of his symptoms. He had a flare up this weekend with overuse, which favors this as being muscular in nature. Sx resolved on Sunday without any treatments tried. Will do one week trial of diclofenac and tizanidine to tx for suspected overuse/ muscular cause. I do not see nor feel a bulge, although hernia is in the differential. Will consider US imaging upon follow up if sx fail to respond to current tx.  No  follow-ups on file.   Maretta Bees, PA

## 2023-09-17 NOTE — Patient Instructions (Signed)
 Take the muscle relaxer three times daily as needed. Keep in mind it may make you feel tired or drowsy, so do not operate machinery or drive a car until you know how it affects you.  Please take the anti-inflammatory medication called in today twice daily with food. Do not take any additional OTC NSAIDS (advil, motrin, ibuprofen, aleve, naproxen).    Try to stay hydrated with WATER as dehydration and caffeine intake can worsen this condition. Consider trial of moist heat applied to the area 2-3x/ day to see if this helps.  Please notify me in one week if your symptoms persist, at which point additional imaging with ultrasound would be conducted.

## 2023-09-24 ENCOUNTER — Encounter: Payer: Self-pay | Admitting: Urgent Care

## 2023-09-24 DIAGNOSIS — R1031 Right lower quadrant pain: Secondary | ICD-10-CM

## 2023-09-28 ENCOUNTER — Ambulatory Visit

## 2023-09-28 ENCOUNTER — Encounter: Payer: Self-pay | Admitting: Urgent Care

## 2023-09-28 DIAGNOSIS — R1031 Right lower quadrant pain: Secondary | ICD-10-CM

## 2023-09-28 DIAGNOSIS — M62838 Other muscle spasm: Secondary | ICD-10-CM

## 2023-09-28 MED ORDER — CARISOPRODOL 350 MG PO TABS: 350.0000 mg | ORAL_TABLET | Freq: Three times a day (TID) | ORAL | 0 refills | Status: AC | PRN
